# Patient Record
Sex: Female | Born: 2000 | Race: Black or African American | Hispanic: No | Marital: Single | Smoking: Never smoker
Health system: Southern US, Community
[De-identification: ages and names within clinical notes are randomized; demographics above are authoritative.]

## PROBLEM LIST (undated history)

## (undated) ENCOUNTER — Ambulatory Visit (HOSPITAL_COMMUNITY): Admission: EM | Payer: BC Managed Care – PPO

## (undated) DIAGNOSIS — Z789 Other specified health status: Secondary | ICD-10-CM

## (undated) HISTORY — PX: FOOT SURGERY: SHX648

## (undated) HISTORY — DX: Other specified health status: Z78.9

---
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
## 2022-01-14 ENCOUNTER — Ambulatory Visit (HOSPITAL_COMMUNITY)
Admission: EM | Admit: 2022-01-14 | Discharge: 2022-01-14 | Disposition: A | Discharge status: Home / Self Care | Payer: BC Managed Care – PPO | Attending: Internal Medicine | Admitting: Internal Medicine

## 2022-01-14 ENCOUNTER — Ambulatory Visit (INDEPENDENT_AMBULATORY_CARE_PROVIDER_SITE_OTHER): Payer: BC Managed Care – PPO

## 2022-01-14 ENCOUNTER — Encounter (HOSPITAL_COMMUNITY): Payer: Self-pay

## 2022-01-14 DIAGNOSIS — M79672 Pain in left foot: Secondary | ICD-10-CM | POA: Diagnosis not present

## 2022-01-14 DIAGNOSIS — W458XXA Other foreign body or object entering through skin, initial encounter: Secondary | ICD-10-CM

## 2022-01-14 DIAGNOSIS — S91312A Laceration without foreign body, left foot, initial encounter: Secondary | ICD-10-CM

## 2022-01-14 DIAGNOSIS — S91322A Laceration with foreign body, left foot, initial encounter: Secondary | ICD-10-CM

## 2022-01-14 NOTE — ED Triage Notes (Signed)
Cut to the left foot last night. States it stopped bleeding. Patient stepped in glass.

## 2022-01-14 NOTE — ED Provider Notes (Addendum)
[facility]    CSN: [id number] Arrival date & time: 01/14/22  1646      History   Chief Complaint Chief Complaint  Patient presents with   Laceration    HPI Pamela Wiley is a 21 y.o. female.   Patient presents to urgent care for evaluation after she stepped on a glass light bulb last night accidentally breaking the light bulb and cutting the bottom of her left foot.  There is a nonbleeding nondraining laceration present to the patient's left foot as a result of stepping on the glass.  She believes that she may have retained glass inside of the foot due to significant pain after the injury.  She is able to move all 5 toes on the left foot voluntarily and denies numbness and tingling distal to injury.  No numbness or tingling to the bilateral lower extremities.  She is able to ambulate with a limp.  She has not noticed any protruding foreign body coming out of the laceration.  She has not attempted use of any over-the-counter medications prior to arrival urgent care for symptoms.     History reviewed. No pertinent past medical history.  There are no problems to display for this patient.   History reviewed. No pertinent surgical history.  OB History   No obstetric history on file.      Home Medications    Prior to Admission medications   Medication Sig Start Date End Date Taking? Authorizing Provider  acetaminophen (TYLENOL) 500 MG tablet Take 2 tablets (1,000 mg total) by mouth every 6 (six) hours as needed. 11/23/21  Yes Talbot Grumbling, FNP  ibuprofen (ADVIL) 600 MG tablet Take 1 tablet (600 mg total) by mouth every 6 (six) hours as needed. 11/23/21  Yes Talbot Grumbling, FNP  fluconazole (DIFLUCAN) 150 MG tablet Take 1 tablet (150 mg total) by mouth daily. 04/27/21   Chase Picket, MD  naproxen (NAPROSYN) 500 MG tablet Take 1 tablet (500 mg total) by mouth 2 (two) times daily with a meal. 01/29/21   Jaynee Eagles, PA-C  penicillin v potassium  (VEETID) 500 MG tablet Take 1 tablet (500 mg total) by mouth 2 (two) times daily. 11/04/21   Nyoka Lint, PA-C    Family History Family History  Family history unknown: Yes    Social History Social History   Tobacco Use   Smoking status: Never   Smokeless tobacco: Never  Vaping Use   Vaping Use: Never used  Substance Use Topics   Alcohol use: No   Drug use: Yes    Types: Marijuana     Allergies   Patient has no known allergies.   Review of Systems Review of Systems Per HPI  Physical Exam Triage Vital Signs ED Triage Vitals [01/14/22 1721]  Enc Vitals Group     BP (!) 139/119     Pulse Rate 62     Resp      Temp 98.5 F (36.9 C)     Temp Source Oral     SpO2 98 %     Weight      Height      Head Circumference      Peak Flow      Pain Score 7     Pain Loc      Pain Edu?      Excl. in [place]?    No data found.  Updated Vital Signs BP (!) 139/119 (BP Location: Right Arm)  Pulse 62   Temp 98.5 F (36.9 C) (Oral)   LMP 12/23/2021 (Approximate)   SpO2 98%   Visual Acuity Right Eye Distance:   Left Eye Distance:   Bilateral Distance:    Right Eye Near:   Left Eye Near:    Bilateral Near:     Physical Exam Vitals and nursing note reviewed.  Constitutional:      Appearance: She is not ill-appearing or toxic-appearing.  HENT:     Head: Normocephalic and atraumatic.     Right Ear: Hearing and external ear normal.     Left Ear: Hearing and external ear normal.     Nose: Nose normal.     Mouth/Throat:     Lips: Pink.  Eyes:     General: Lids are normal. Vision grossly intact. Gaze aligned appropriately.     Extraocular Movements: Extraocular movements intact.     Conjunctiva/sclera: Conjunctivae normal.  Pulmonary:     Effort: Pulmonary effort is normal.  Musculoskeletal:     Cervical back: Neck supple.  Skin:    General: Skin is warm and dry.     Capillary Refill: Capillary refill takes less than 2 seconds.     Findings: No rash.      Comments: 0.5 to 1 cm laceration present to the plantar aspect of the left foot as seen below in image.  No drainage, soft tissue swelling, or surrounding area of erythema present.  Patient has full range of motion of the left foot and sensation is intact distally to injury.  Capillary refill is less than 3 with a +2 dorsalis pedis pulse on the left foot.  Neurological:     General: No focal deficit present.     Mental Status: She is alert and oriented to person, place, and time. Mental status is at baseline.     Cranial Nerves: No dysarthria or facial asymmetry.  Psychiatric:        Mood and Affect: Mood normal.        Speech: Speech normal.        Behavior: Behavior normal.        Thought Content: Thought content normal.        Judgment: Judgment normal.       UC Treatments / Results  Labs (all labs ordered are listed, but only abnormal results are displayed) Labs Reviewed - No data to display  EKG   Radiology DG Foot Complete Left  Result Date: 01/14/2022 CLINICAL DATA:  Glass laceration. Patient stepped on glass last night. EXAM: LEFT FOOT - COMPLETE 3+ VIEW COMPARISON:  Left ankle radiographs 06/12/2021, left foot radiographs 06/26/2017 FINDINGS: Pes planus is suggested on these limited non weight-bearing views. 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 There is a triangular low-density focus measuring up to 5 mm within the plantar midfoot soft tissues approximately 5 mm deep to the plantar skin surface, at the AP dimension of the cuboid, however this is not visualized on the other views for transverse foot dimension localization. No acute fracture or dislocation. 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 Triangular low-density focus within the plantar midfoot soft tissues could  represent a low-density foreign body. Recommend clinical correlation. Electronically Signed   By: Neita Garnet M.D.   On: 01/14/2022 18:14    Procedures Foreign Body Removal  Date/Time: 01/14/2022 8:50 PM  Performed by: Carlisle Beers, FNP Authorized by: Reita May  M, FNP   Consent:    Consent obtained:  Verbal   Consent given by:  Patient   Risks, benefits, and alternatives were discussed: yes     Risks discussed:  Bleeding, infection, pain, worsening of condition, poor cosmetic result, nerve damage and incomplete removal   Alternatives discussed:  No treatment and referral Universal protocol:    Imaging studies available: yes (X-ray)     Patient identity confirmed:  Verbally with patient Location:    Location:  Foot   Foot location:  L sole Pre-procedure details:    Imaging:  X-ray   Neurovascular status: intact   Procedure type:    Procedure complexity:  Simple Procedure details:    Localization method:  Probed   Removal mechanism:  Hemostat   Foreign bodies recovered:  1   Description:  Triangular shaped piece of glass   Intact foreign body removal: yes   Post-procedure details:    Neurovascular status: intact     Skin closure:  None   Dressing:  Adhesive bandage   Procedure completion:  Tolerated well, no immediate complications Comments:     Triangular shaped piece of glass removed with tweezers from patient's laceration. Foreign body at depth of subcutaneous tissue. Patient tolerated procedure well without local numbing.  Low suspicion for remaining foreign body to the laceration.  Full depth of the laceration explored with probing using tweezers without evidence of further foreign body present to the wound cavity.   (including critical care time)  Medications Ordered in UC Medications - No data to display  Initial Impression / Assessment and Plan / UC Course  I have reviewed the triage vital signs and the nursing notes.  Pertinent labs & imaging  results that were available during my care of the patient were reviewed by me and considered in my medical decision making (see chart for details).   1.  Laceration of the left foot and foreign body removal X-ray shows possible evidence of triangular low-density foreign body to the middle aspect of the foot at the location of the laceration in the subcutaneous tissues.  Wound explored with tweezers and foreign body removed from laceration successfully.  Minimal bleeding present after procedure.  Bandage placed on laceration.  Suspect I was able to remove entire foreign body as I was able to explore the wound cavity with the tweezers and could not feel or hear any further evidence of foreign body to the wound.  Patient tolerated this well without local anesthesia.  Advised patient to purchase over-the-counter triple antibiotic ointment and placed this on the wound over the next few days twice daily to prevent infection.  The wound does not appear infected at this time and there is no clinical indication for oral antibiotic therapy.  Walking referral to podiatry given to be used as needed for follow-up if symptoms fail to improve or persist. Patient expresses agreement with plan.  Of note, blood pressure initially read at 139/119.  Upon recheck prior to discharge, blood pressure reduced to 114/61.  Patient is asymptomatic and does not have a previous history of elevated blood pressure.  Discussed physical exam and available lab work findings in clinic with patient.  Counseled patient regarding appropriate use of medications and potential side effects for all medications recommended or prescribed today. Discussed red flag signs and symptoms of worsening condition,when to call the PCP office, return to urgent care, and when to seek higher level of care in the emergency department. Patient verbalizes understanding and agreement  with plan. All questions answered. Patient discharged in stable condition.    Final  Clinical Impressions(s) / UC Diagnoses   Final diagnoses:  Laceration of left foot, initial encounter  Other foreign body or object entering through skin, initial encounter     Discharge Instructions      We removed a piece of glass out of your left foot today.  Please over-the-counter antibiotic ointment to the bottom of your foot and keep the area covered with a Band-Aid for the next few days.  You may follow-up with the podiatrist for further evaluation of left foot pain after foreign body removal and for further evaluation.   Take 1000 mg Tylenol every 6 hours as needed for pain at home.  If you develop any new or worsening symptoms or do not improve in the next 2 to 3 days, please return.  If your symptoms are severe, please go to the emergency room.  Follow-up with your primary care provider for further evaluation and management of your symptoms as well as ongoing wellness visits.  I hope you feel better!     ED Prescriptions   None    PDMP not reviewed this encounter.   Carlisle Beers, FNP 01/14/22 2158    Carlisle Beers, FNP 01/26/22 [phone]

## 2022-01-14 NOTE — Discharge Instructions (Addendum)
We removed a piece of glass out of your left foot today.  Please over-the-counter antibiotic ointment to the bottom of your foot and keep the area covered with a Band-Aid for the next few days.  You may follow-up with the podiatrist for further evaluation of left foot pain after foreign body removal and for further evaluation.   Take 1000 mg Tylenol every 6 hours as needed for pain at home.  If you develop any new or worsening symptoms or do not improve in the next 2 to 3 days, please return.  If your symptoms are severe, please go to the emergency room.  Follow-up with your primary care provider for further evaluation and management of your symptoms as well as ongoing wellness visits.  I hope you feel better!

## 2022-01-23 ENCOUNTER — Encounter (HOSPITAL_COMMUNITY): Payer: Self-pay | Admitting: *Deleted

## 2022-01-23 ENCOUNTER — Ambulatory Visit (INDEPENDENT_AMBULATORY_CARE_PROVIDER_SITE_OTHER): Payer: Medicaid Other

## 2022-01-23 ENCOUNTER — Ambulatory Visit (HOSPITAL_COMMUNITY)
Admission: EM | Admit: 2022-01-23 | Discharge: 2022-01-23 | Disposition: A | Discharge status: Home / Self Care | Payer: Medicaid Other | Attending: Physician Assistant | Admitting: Physician Assistant

## 2022-01-23 DIAGNOSIS — M545 Low back pain, unspecified: Secondary | ICD-10-CM | POA: Diagnosis not present

## 2022-01-23 DIAGNOSIS — Z113 Encounter for screening for infections with a predominantly sexual mode of transmission: Secondary | ICD-10-CM | POA: Diagnosis not present

## 2022-01-23 DIAGNOSIS — M79672 Pain in left foot: Secondary | ICD-10-CM | POA: Diagnosis present

## 2022-01-23 DIAGNOSIS — S90852D Superficial foreign body, left foot, subsequent encounter: Secondary | ICD-10-CM | POA: Diagnosis not present

## 2022-01-23 LAB — POCT URINALYSIS DIPSTICK, ED / UC
Bilirubin Urine: NEGATIVE
Glucose, UA: NEGATIVE mg/dL
Hgb urine dipstick: NEGATIVE
Ketones, ur: NEGATIVE mg/dL
Leukocytes,Ua: NEGATIVE
Nitrite: NEGATIVE
Protein, ur: NEGATIVE mg/dL
Specific Gravity, Urine: 1.01 (ref 1.005–1.030)
Urobilinogen, UA: 0.2 mg/dL (ref 0.0–1.0)
pH: 6 (ref 5.0–8.0)

## 2022-01-23 LAB — POC URINE PREG, ED: Preg Test, Ur: NEGATIVE

## 2022-01-23 LAB — HIV ANTIBODY (ROUTINE TESTING W REFLEX): HIV Screen 4th Generation wRfx: NONREACTIVE

## 2022-01-23 MED ORDER — DOXYCYCLINE HYCLATE 100 MG PO CAPS: 100.0000 mg | ORAL_CAPSULE | Freq: Two times a day (BID) | ORAL | 0 refills | Status: DC

## 2022-01-23 NOTE — ED Provider Notes (Signed)
[facility]    CSN: [id number] Arrival date & time: 01/23/22  1042      History   Chief Complaint Chief Complaint  Patient presents with   Foot Pain   SEXUALLY TRANSMITTED DISEASE    HPI Pamela Wiley is a 21 y.o. female.   21 year old female presents with left foot pain and STI screening.  Patient returns today for reevaluation of her left foot pain.  She indicates that she was seen on 01/14/2022 for glass in the foot and laceration.  She was evaluated, treated and a piece of wedge shaped glass was removed.  Patient indicates she still continues with pain at the bottom of the left foot where the laceration site is, mild swelling, and minimal redness.  She indicates having pain when she walks or puts pressure on the foot.  She has been taking Tylenol but this has not been giving her relief.  She has concerned about there still being pain in the left foot since its been over a week since the accident.  She denies any fever or chills.  She has not seen a podiatrist although she was given a walking referral at her initial visit on 01/14/2022. Patient also request to have STI testing to include HIV and RPR.  Patient indicates that her last sexual encounter was several months ago and it was protected.  She indicates that she is not having any symptoms, no vaginal discharge.  She requests to also have a UA test although she is not have any frequency, urgency or dysuria.  She does relate having some low back pain which is intermittent due to "the size of my breasts".  She relates that her last period was September 10 and was regular.  She denies any fever, nausea or vomiting.   Foot Pain    History reviewed. No pertinent past medical history.  There are no problems to display for this patient.   History reviewed. No pertinent surgical history.  OB History   No obstetric history on file.      Home Medications    Prior to Admission medications   Medication Sig Start  Date End Date Taking? Authorizing Provider  acetaminophen (TYLENOL) 500 MG tablet Take 2 tablets (1,000 mg total) by mouth every 6 (six) hours as needed. 11/23/21  Yes Carlisle Beers, FNP  doxycycline (VIBRAMYCIN) 100 MG capsule Take 1 capsule (100 mg total) by mouth 2 (two) times daily. 01/23/22  Yes Ellsworth Lennox, PA-C  ibuprofen (ADVIL) 600 MG tablet Take 1 tablet (600 mg total) by mouth every 6 (six) hours as needed. 11/23/21  Yes Carlisle Beers, FNP  naproxen (NAPROSYN) 500 MG tablet Take 1 tablet (500 mg total) by mouth 2 (two) times daily with a meal. 01/29/21  Yes Wallis Bamberg, PA-C  fluconazole (DIFLUCAN) 150 MG tablet Take 1 tablet (150 mg total) by mouth daily. 04/27/21   Lamptey, Britta Mccreedy, MD  penicillin v potassium (VEETID) 500 MG tablet Take 1 tablet (500 mg total) by mouth 2 (two) times daily. 11/04/21   Ellsworth Lennox, PA-C    Family History Family History  Family history unknown: Yes    Social History Social History   Tobacco Use   Smoking status: Never   Smokeless tobacco: Never  Vaping Use   Vaping Use: Never used  Substance Use Topics   Alcohol use: No   Drug use: Yes    Types: Marijuana     Allergies   Patient has no known allergies.  Review of Systems Review of Systems  Musculoskeletal:  Positive for joint swelling (left foot at laceration site).     Physical Exam Triage Vital Signs ED Triage Vitals  Enc Vitals Group     BP 01/23/22 1119 118/80     Pulse Rate 01/23/22 1119 68     Resp 01/23/22 1119 18     Temp 01/23/22 1119 98.1 F (36.7 C)     Temp Source 01/23/22 1119 Oral     SpO2 01/23/22 1119 97 %     Weight --      Height --      Head Circumference --      Peak Flow --      Pain Score 01/23/22 1118 7     Pain Loc --      Pain Edu? --      Excl. in [place]? --    No data found.  Updated Vital Signs BP 118/80 (BP Location: Right Arm)   Pulse 68   Temp 98.1 F (36.7 C) (Oral)   Resp 18   LMP 12/23/2021 (Approximate)   SpO2  97%   Visual Acuity Right Eye Distance:   Left Eye Distance:   Bilateral Distance:    Right Eye Near:   Left Eye Near:    Bilateral Near:     Physical Exam Constitutional:      Appearance: Normal appearance.  Musculoskeletal:       Feet:  Neurological:     Mental Status: She is alert.      UC Treatments / Results  Labs (all labs ordered are listed, but only abnormal results are displayed) Labs Reviewed  RPR  HIV ANTIBODY (ROUTINE TESTING W REFLEX)  POCT URINALYSIS DIPSTICK, ED / UC  POC URINE PREG, ED  CERVICOVAGINAL ANCILLARY ONLY    EKG   Radiology DG Foot Complete Left  Result Date: 01/23/2022 CLINICAL DATA:  Glass removed from mid plantar arch of foot 01/14/2022. persistent pain. EXAM: LEFT FOOT - COMPLETE 3+ VIEW COMPARISON:  Left foot radiographs 01/14/2022 FINDINGS: The triangular density measuring up to approximately 5 mm overlying the plantar midfoot subcutaneous fat just deep to the skin surface is again seen on lateral view but not on the frontal or oblique views, possibly due to overlying bones. This is unchanged from prior and again could represent a low-density foreign body such as glass, however is not definitive. This is not significantly changed from 01/14/2022. Redemonstration of cartilaginous versus fibrous calcaneonavicular coalition with mild joint space narrowing and moderate subchondral sclerosis and peripheral osteophytosis. No acute fracture or dislocation. Pes planus. IMPRESSION: 1. No significant change from prior. There is again a triangular low-density focus within the plantar midfoot soft tissues. This may represent the patient's normal soft tissues as it is unchanged from prior following reported removal of glass from this mid plantar arch location of the patient's persistent pain. Recommend clinical correlation for a possible persistent glass foreign body in this region. 2. 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 Electronically Signed   By: Yvonne Kendall M.D.   On: 01/23/2022 12:26    Procedures Procedures (including critical care time)  Medications Ordered in UC Medications - No data to display  Initial Impression / Assessment and Plan / UC Course  I have reviewed the triage vital signs and the nursing notes.  Pertinent labs & imaging results that were available during my care of the patient were reviewed by me and considered in my  medical decision making (see chart for details).    Plan: 1.  STI screening will be treated with the following: A.  STI testing to include HIV and RPR is pending. 2.  The acute low back pain will be treated with the following: A.  Advised take ibuprofen 600 mg every 8 hours to treat the low back pain. 3.  Left foot will be treated with the following: A.  Ibuprofen every 8 hours with food to help reduce pain and swelling. B.  Epsom salt soaks, 3-4 times throughout the evening to help reduce pain and swelling. C.  Patient advised to call and set up an appointment for podiatry to have the left foot evaluated. D.  Doxycycline 100 mg every 12 hours to treat infection. 4.  Advised to follow-up PCP return to urgent care if symptoms fail to improve.  Final Clinical Impressions(s) / UC Diagnoses   Final diagnoses:  Left foot pain  Foreign body in left foot, subsequent encounter  Routine screening for STI (sexually transmitted infection)  Acute midline low back pain without sciatica     Discharge Instructions      Lab testing will be completed in 48 to 72 hours.  If you do not get a call from this office in 48 to 72 hours this indicates lab tests are negative.  Log into MyChart to view the test results when they post in 48 to 72 hours.  Advised to use Epsom salt soaks, 1 tablespoon in warm water, soak for 10 minutes, repeat 3-4 times of the evening to help decrease swelling and pain.  Advised take ibuprofen 600 mg  every 8 hours with food to help decrease pain and swelling.  Advised to call [facility] for an appointment  [phone]) and evaluate the left foot pain and swelling.  Advised to follow-up with PCP or return to urgent care if symptoms fail to improve.      ED Prescriptions     Medication Sig Dispense Auth. Provider   doxycycline (VIBRAMYCIN) 100 MG capsule Take 1 capsule (100 mg total) by mouth 2 (two) times daily. 20 capsule Ellsworth Lennox, PA-C      PDMP not reviewed this encounter.   Ellsworth Lennox, PA-C 01/23/22 1233

## 2022-01-23 NOTE — Discharge Instructions (Addendum)
Lab testing will be completed in 48 to 72 hours.  If you do not get a call from this office in 48 to 72 hours this indicates lab tests are negative.  Log into MyChart to view the test results when they post in 48 to 72 hours.  Advised to use Epsom salt soaks, 1 tablespoon in warm water, soak for 10 minutes, repeat 3-4 times of the evening to help decrease swelling and pain.  Advised take ibuprofen 600 mg every 8 hours with food to help decrease pain and swelling.  Advised to call [facility] specialist for an appointment  [phone]) and evaluate the left foot pain and swelling.  Advised to follow-up with PCP or return to urgent care if symptoms fail to improve.

## 2022-01-23 NOTE — ED Triage Notes (Signed)
Pt states she had glass took out of her left foot on 01/14/2022 but her foot is still hurting she is taking tylenol but not helping.   She would also like all STI testing states no sx and no known exposures just wants it all tested.

## 2022-01-24 LAB — CERVICOVAGINAL ANCILLARY ONLY
Bacterial Vaginitis (gardnerella): NEGATIVE
Candida Glabrata: NEGATIVE
Candida Vaginitis: NEGATIVE
Chlamydia: NEGATIVE
Comment: NEGATIVE
Comment: NEGATIVE
Comment: NEGATIVE
Comment: NEGATIVE
Comment: NEGATIVE
Comment: NORMAL
Neisseria Gonorrhea: NEGATIVE
Trichomonas: NEGATIVE

## 2022-01-24 LAB — RPR: RPR Ser Ql: NONREACTIVE

## 2022-01-27 ENCOUNTER — Telehealth: Payer: Self-pay | Admitting: Podiatry

## 2022-01-27 NOTE — Telephone Encounter (Signed)
Lvm for patient to call and schedule an appointment to pick up orthotics  ?

## 2022-01-31 ENCOUNTER — Ambulatory Visit (INDEPENDENT_AMBULATORY_CARE_PROVIDER_SITE_OTHER): Payer: Medicaid Other

## 2022-01-31 DIAGNOSIS — M2141 Flat foot [pes planus] (acquired), right foot: Secondary | ICD-10-CM

## 2022-01-31 DIAGNOSIS — M2142 Flat foot [pes planus] (acquired), left foot: Secondary | ICD-10-CM

## 2022-01-31 NOTE — Progress Notes (Signed)
Patient presents today to pick up custom molded foot orthotics, diagnosed with pes planus by Dr. Sherryle Lis.   Orthotics were dispensed and fit was satisfactory. Reviewed instructions for break-in and wear. Written instructions given to patient.  Patient will follow up as needed.   [facility] - order # [id number]

## 2022-02-13 ENCOUNTER — Ambulatory Visit (INDEPENDENT_AMBULATORY_CARE_PROVIDER_SITE_OTHER): Payer: Medicaid Other | Admitting: Podiatry

## 2022-02-13 ENCOUNTER — Telehealth: Payer: Self-pay | Admitting: Podiatry

## 2022-02-13 ENCOUNTER — Ambulatory Visit (INDEPENDENT_AMBULATORY_CARE_PROVIDER_SITE_OTHER): Payer: Medicaid Other

## 2022-02-13 ENCOUNTER — Encounter: Payer: Self-pay | Admitting: Podiatry

## 2022-02-13 DIAGNOSIS — S90852A Superficial foreign body, left foot, initial encounter: Secondary | ICD-10-CM | POA: Diagnosis not present

## 2022-02-13 NOTE — Telephone Encounter (Addendum)
DOS: 02/23/2022  [employer]  Procedure: Ex. Foreign Body-Complicated Lt (38101)  DX: M79.5  Prior Authorization is required per [employer] on the [employer].   Authorization #: [id number] Authorization Valid 02/24/2028 - 03/24/2022

## 2022-02-13 NOTE — Progress Notes (Signed)
  Subjective:  Patient ID: Pamela Wiley, female    DOB: [birth year],  MRN: [id number]  Chief Complaint  Patient presents with    Laceration     Left foot cut, went to urgent care - she stepped on a light bulb 2 or 3 weeks ago and it broke under her foot - Urgent care did xrays and got out what they could - still feels like there is something in it    21 y.o. female presents with the above complaint. History confirmed with patient.  They took some of the glass out but they think it may have broken off  Objective:  Physical Exam: warm, good capillary refill, no trophic changes or ulcerative lesions, normal DP and PT pulses, normal sensory exam, and clear puncture wound plantar left heel, tender to touch no signs of infection   Radiographs: Multiple views x-ray of the left foot: New films taken today show residual foreign body retained Assessment:   1. Foreign body in left foot, initial encounter      Plan:  Patient was evaluated and treated and all questions answered.  We reviewed her radiographs.  Discussed that she likely does have some foreign body retained.  We discussed further excision of this.  I do think this should be done in the operating room for stability and for pain control for the patient so she may have some sedation.  We discussed the risk benefits and potential complications of the procedure.  We discussed that if a significant incision is needed she will need to be nonweightbearing after surgery.  Surgery will be scheduled for next week informed consent was signed and reviewed today.  No follow-ups on file.

## 2022-02-14 NOTE — Progress Notes (Signed)
Subjective:    Pamela Wiley - 21 y.o. female MRN [id number]  Date of birth: 11-13-2000  HPI  Pamela Wiley is a 21 year-old female to establish care and annual physical exam.  Current issues and/or concerns: - Concerns of keloids. Reports in the past when she pierced the lower left earlobe a keloid developed. States when she pierces the upper ear she does not develop keloids. Her mother also has history of keloids. The left keloid is asymptomatic and has been the same size for some time. She is not ready for referral to Dermatology as of present. She is aware to notify me if she would like a referral in the future.  - Established with Podiatry. Reports plan to have left foot surgery soon due to glass in foot secondary to stepping on a light bulb. - Established with Gynecology.    ROS per HPI    Health Maintenance: Health Maintenance Due  Topic Date Due   COVID-19 Vaccine (1) Never done    Past Medical History: Patient Active Problem List   Diagnosis Date Noted   Tonsillitis 06/04/2018    Social History   reports that she has never smoked. She has never been exposed to tobacco smoke. She has never used smokeless tobacco. She reports current alcohol use of about 2.0 standard drinks of alcohol per week. She reports current drug use. Drug: Marijuana.   Family History  Family history is unknown by patient.   Medications: reviewed and updated   Objective:   Physical Exam BP 108/73 (BP Location: Left Arm, Patient Position: Sitting, Cuff Size: Normal)   Pulse 100   Temp 98.3 F (36.8 C)   Resp 16   Ht 5' 2.99" (1.6 m)   Wt 185 lb (83.9 kg)   SpO2 98%   BMI 32.78 kg/m   Physical Exam HENT:     Head: Normocephalic and atraumatic.     Right Ear: Tympanic membrane, ear canal and external ear normal.     Left Ear: Tympanic membrane, ear canal and external ear normal.     Ears:     Comments: Left earlobe keloid. No evidence of drainage or  pain.    Nose: Nose normal.     Mouth/Throat:     Mouth: Mucous membranes are moist.     Pharynx: Oropharynx is clear.  Eyes:     Extraocular Movements: Extraocular movements intact.     Conjunctiva/sclera: Conjunctivae normal.     Pupils: Pupils are equal, round, and reactive to light.  Cardiovascular:     Rate and Rhythm: Normal rate and regular rhythm.     Pulses: Normal pulses.     Heart sounds: Normal heart sounds.  Pulmonary:     Effort: Pulmonary effort is normal.     Breath sounds: Normal breath sounds.  Chest:     Comments: Patient declined.  Abdominal:     General: Bowel sounds are normal.     Palpations: Abdomen is soft.  Genitourinary:    Comments: Patient declined. Musculoskeletal:        General: Normal range of motion.     Right shoulder: Normal.     Left shoulder: Normal.     Right upper arm: Normal.     Left upper arm: Normal.     Right elbow: Normal.     Left elbow: Normal.     Right forearm: Normal.     Left forearm: Normal.     Right wrist: Normal.  Left wrist: Normal.     Right hand: Normal.     Left hand: Normal.     Cervical back: Normal, normal range of motion and neck supple.     Thoracic back: Normal.     Lumbar back: Normal.     Right hip: Normal.     Left hip: Normal.     Right upper leg: Normal.     Left upper leg: Normal.     Right knee: Normal.     Left knee: Normal.     Right lower leg: Normal.     Left lower leg: Normal.     Right ankle: Normal.     Left ankle: Normal.     Right foot: Normal.     Left foot: Normal.  Skin:    General: Skin is warm and dry.     Capillary Refill: Capillary refill takes less than 2 seconds.  Neurological:     General: No focal deficit present.     Mental Status: She is alert and oriented to person, place, and time.  Psychiatric:        Mood and Affect: Mood normal.        Behavior: Behavior normal.      Assessment & Plan:  1. Encounter to establish care 2. Annual physical exam -  Counseled on 150 minutes of exercise per week as tolerated, healthy eating (including decreased daily intake of saturated fats, cholesterol, added sugars, sodium), STI prevention, and routine healthcare maintenance.  3. Screening for metabolic disorder - Routine screening.  - CMP14+EGFR  4. Screening for deficiency anemia - Routine screening.  - CBC  5. Diabetes mellitus screening - Routine screening.  - Hemoglobin A1c  6. Screening cholesterol level - Routine screening.  - Lipid panel  7. Thyroid disorder screen - Routine screening.  - TSH  8. Keloid - Patient declined referral to Dermatology as of present.     Patient was given clear instructions to go to Emergency Department or return to medical center if symptoms don't improve, worsen, or new problems develop.The patient verbalized understanding.  I discussed the assessment and treatment plan with the patient. The patient was provided an opportunity to ask questions and all were answered. The patient agreed with the plan and demonstrated an understanding of the instructions.   The patient was advised to call back or seek an in-person evaluation if the symptoms worsen or if the condition fails to improve as anticipated.    Durene Fruits, NP 02/20/2022, 9:23 AM Primary Care at [facility]

## 2022-02-20 ENCOUNTER — Ambulatory Visit (INDEPENDENT_AMBULATORY_CARE_PROVIDER_SITE_OTHER): Payer: Medicaid Other | Admitting: Family

## 2022-02-20 ENCOUNTER — Encounter: Payer: Self-pay | Admitting: Family

## 2022-02-20 VITALS — BP 108/73 | HR 100 | Temp 98.3°F | Resp 16 | Ht 62.99 in | Wt 185.0 lb

## 2022-02-20 DIAGNOSIS — Z1329 Encounter for screening for other suspected endocrine disorder: Secondary | ICD-10-CM | POA: Diagnosis not present

## 2022-02-20 DIAGNOSIS — Z13220 Encounter for screening for lipoid disorders: Secondary | ICD-10-CM

## 2022-02-20 DIAGNOSIS — Z131 Encounter for screening for diabetes mellitus: Secondary | ICD-10-CM

## 2022-02-20 DIAGNOSIS — Z130 Encounter for screening for diseases of the blood and blood-forming organs and certain disorders involving the immune mechanism: Secondary | ICD-10-CM

## 2022-02-20 DIAGNOSIS — Z13228 Encounter for screening for other metabolic disorders: Secondary | ICD-10-CM | POA: Diagnosis not present

## 2022-02-20 DIAGNOSIS — Z0000 Encounter for general adult medical examination without abnormal findings: Secondary | ICD-10-CM

## 2022-02-20 DIAGNOSIS — Z7689 Persons encountering health services in other specified circumstances: Secondary | ICD-10-CM

## 2022-02-20 DIAGNOSIS — L910 Hypertrophic scar: Secondary | ICD-10-CM

## 2022-02-20 DIAGNOSIS — Z0001 Encounter for general adult medical examination with abnormal findings: Secondary | ICD-10-CM | POA: Diagnosis not present

## 2022-02-20 NOTE — Patient Instructions (Signed)
Preventive Care 50-19 Years Old, Female Preventive care refers to lifestyle choices and visits with your health care provider that can promote health and wellness. At this stage in your life, you may start seeing a primary care physician instead of a pediatrician for your preventive care. Preventive care visits are also called wellness exams. What can I expect for my preventive care visit? Counseling During your preventive care visit, your health care provider may ask about your: Medical history, including: Past medical problems. Family medical history. Pregnancy history. Current health, including: Menstrual cycle. Method of birth control. Emotional well-being. Home life and relationship well-being. Sexual activity and sexual health. Lifestyle, including: Alcohol, nicotine or tobacco, and drug use. Access to firearms. Diet, exercise, and sleep habits. Sunscreen use. Motor vehicle safety. Physical exam Your health care provider may check your: Height and weight. These may be used to calculate your BMI (body mass index). BMI is a measurement that tells if you are at a healthy weight. Waist circumference. This measures the distance around your waistline. This measurement also tells if you are at a healthy weight and may help predict your risk of certain diseases, such as type 2 diabetes and high blood pressure. Heart rate and blood pressure. Body temperature. Skin for abnormal spots. Breasts. What immunizations do I need?  Vaccines are usually given at various ages, according to a schedule. Your health care provider will recommend vaccines for you based on your age, medical history, and lifestyle or other factors, such as travel or where you work. What tests do I need? Screening Your health care provider may recommend screening tests for certain conditions. This may include: Vision and hearing tests. Lipid and cholesterol levels. Pelvic exam and Pap test. Hepatitis B  test. Hepatitis C test. HIV (human immunodeficiency virus) test. STI (sexually transmitted infection) testing, if you are at risk. Tuberculosis skin test if you have symptoms. BRCA-related cancer screening. This may be done if you have a family history of breast, ovarian, tubal, or peritoneal cancers. Talk with your health care provider about your test results, treatment options, and if necessary, the need for more tests. Follow these instructions at home: Eating and drinking  Eat a healthy diet that includes fresh fruits and vegetables, whole grains, lean protein, and low-fat dairy products. Drink enough fluid to keep your urine pale yellow. Do not drink alcohol if: Your health care provider tells you not to drink. You are pregnant, may be pregnant, or are planning to become pregnant. You are under the legal drinking age. In the [place] the legal drinking age is 67. If you drink alcohol: Limit how much you have to 0-1 drink a day. Know how much alcohol is in your drink. In the [place] one drink equals one 12 oz bottle of beer (355 mL), one 5 oz glass of wine (148 mL), or one 1 oz glass of hard liquor (44 mL). Lifestyle Brush your teeth every morning and night with fluoride toothpaste. Floss one time each day. Exercise for at least 30 minutes 5 or more days of the week. Do not use any products that contain nicotine or tobacco. These products include cigarettes, chewing tobacco, and vaping devices, such as e-cigarettes. If you need help quitting, ask your health care provider. Do not use drugs. If you are sexually active, practice safe sex. Use a condom or other form of protection to prevent STIs. If you do not wish to become pregnant, use a form of birth control. If you plan to become pregnant,  see your health care provider for a prepregnancy visit. Find healthy ways to manage stress, such as: Meditation, yoga, or listening to music. Journaling. Talking to a trusted person. Spending time  with friends and family. Safety Always wear your seat belt while driving or riding in a vehicle. Do not drive: If you have been drinking alcohol. Do not ride with someone who has been drinking. When you are tired or distracted. While texting. If you have been using any mind-altering substances or drugs. Wear a helmet and other protective equipment during sports activities. If you have firearms in your house, make sure you follow all gun safety procedures. Seek help if you have been bullied, physically abused, or sexually abused. Use the internet responsibly to avoid dangers, such as online bullying and online sex predators. What's next? Go to your health care provider once a year for an annual wellness visit. Ask your health care provider how often you should have your eyes and teeth checked. Stay up to date on all vaccines. This information is not intended to replace advice given to you by your health care provider. Make sure you discuss any questions you have with your health care provider. Document Revised: 09/28/2020 Document Reviewed: 09/28/2020 Elsevier Patient Education  2023 [employer]vier Inc.  

## 2022-02-20 NOTE — Progress Notes (Signed)
.  Pt presents to establish care,  -haven't seen PCP in years  -has surgery in 3 days for foot

## 2022-02-21 ENCOUNTER — Encounter: Payer: Self-pay | Admitting: Family

## 2022-02-21 DIAGNOSIS — R7303 Prediabetes: Secondary | ICD-10-CM | POA: Insufficient documentation

## 2022-02-21 LAB — LIPID PANEL
Chol/HDL Ratio: 2 ratio (ref 0.0–4.4)
Cholesterol, Total: 79 mg/dL — ABNORMAL LOW (ref 100–169)
HDL: 40 mg/dL (ref >39)
LDL Chol Calc (NIH): 26 mg/dL (ref 0–109)
Triglycerides: 51 mg/dL (ref 0–89)
VLDL Cholesterol Cal: 13 mg/dL (ref 5–40)

## 2022-02-21 LAB — CMP14+EGFR
ALT: 12 IU/L (ref 0–32)
AST: 16 IU/L (ref 0–40)
Albumin/Globulin Ratio: 1.4 (ref 1.2–2.2)
Albumin: 4.2 g/dL (ref 4.0–5.0)
Alkaline Phosphatase: 79 IU/L (ref 42–106)
BUN/Creatinine Ratio: 15 (ref 9–23)
BUN: 9 mg/dL (ref 6–20)
Bilirubin Total: 0.2 mg/dL (ref 0.0–1.2)
CO2: 23 mmol/L (ref 20–29)
Calcium: 8.9 mg/dL (ref 8.7–10.2)
Chloride: 103 mmol/L (ref 96–106)
Creatinine, Ser: 0.6 mg/dL (ref 0.57–1.00)
Globulin, Total: 3 g/dL (ref 1.5–4.5)
Glucose: 102 mg/dL — ABNORMAL HIGH (ref 70–99)
Potassium: 3.7 mmol/L (ref 3.5–5.2)
Sodium: 139 mmol/L (ref 134–144)
Total Protein: 7.2 g/dL (ref 6.0–8.5)
eGFR: 133 mL/min/{1.73_m2} (ref >59)

## 2022-02-21 LAB — HEMOGLOBIN A1C
Est. average glucose Bld gHb Est-mCnc: 123 mg/dL
Hgb A1c MFr Bld: 5.9 % — ABNORMAL HIGH (ref 4.8–5.6)

## 2022-02-21 LAB — CBC
Hematocrit: 37.9 % (ref 34.0–46.6)
Hemoglobin: 11.9 g/dL (ref 11.1–15.9)
MCH: 24.6 pg — ABNORMAL LOW (ref 26.6–33.0)
MCHC: 31.4 g/dL — ABNORMAL LOW (ref 31.5–35.7)
MCV: 78 fL — ABNORMAL LOW (ref 79–97)
Platelets: 332 10*3/uL (ref 150–450)
RBC: 4.84 x10E6/uL (ref 3.77–5.28)
RDW: 13.7 % (ref 11.7–15.4)
WBC: 9.4 10*3/uL (ref 3.4–10.8)

## 2022-02-21 LAB — TSH: TSH: 1.69 u[IU]/mL (ref 0.450–4.500)

## 2022-02-23 ENCOUNTER — Other Ambulatory Visit: Payer: Self-pay | Admitting: Podiatry

## 2022-02-23 DIAGNOSIS — S91332A Puncture wound without foreign body, left foot, initial encounter: Secondary | ICD-10-CM

## 2022-02-23 MED ORDER — TRAMADOL HCL 50 MG PO TABS: 50.0000 mg | ORAL_TABLET | Freq: Four times a day (QID) | ORAL | 0 refills | Status: AC | PRN

## 2022-02-23 MED ORDER — IBUPROFEN 800 MG PO TABS: 800.0000 mg | ORAL_TABLET | Freq: Three times a day (TID) | ORAL | 0 refills | Status: DC | PRN

## 2022-02-23 NOTE — Progress Notes (Signed)
11/10 removal of glass from left foot

## 2022-03-01 ENCOUNTER — Ambulatory Visit (INDEPENDENT_AMBULATORY_CARE_PROVIDER_SITE_OTHER): Payer: Medicaid Other

## 2022-03-01 ENCOUNTER — Ambulatory Visit (INDEPENDENT_AMBULATORY_CARE_PROVIDER_SITE_OTHER): Payer: Medicaid Other | Admitting: Podiatry

## 2022-03-01 DIAGNOSIS — S90852A Superficial foreign body, left foot, initial encounter: Secondary | ICD-10-CM

## 2022-03-01 NOTE — Progress Notes (Signed)
  Subjective:  Patient ID: Pamela Wiley, female    DOB: [birth year],  MRN: [id number]  Chief Complaint  Patient presents with   Post-op Follow-up    Post op visit #1 DOS 02/23/2022 GLASS REMOVAL LEFT FOOT     21 y.o. female returns for post-op check.  She is doing well using the crutches  Review of Systems: Negative except as noted in the HPI. Denies N/V/F/Ch.   Objective:  There were no vitals filed for this visit. There is no height or weight on file to calculate BMI. Constitutional Well developed. Well nourished.  Vascular Foot warm and well perfused. Capillary refill normal to all digits.  Calf is soft and supple, no posterior calf or knee pain, negative Homans' sign  Neurologic Normal speech. Oriented to person, place, and time. Epicritic sensation to light touch grossly present bilaterally.  Dermatologic Skin healing well without signs of infection. Skin edges well coapted without signs of infection.  Orthopedic: Tenderness to palpation noted about the surgical site.   Multiple view plain film radiographs: Interval removal of foreign body Assessment:   1. Foreign body in left foot, initial encounter    Plan:  Patient was evaluated and treated and all questions answered.  S/p foot surgery left -Progressing as expected post-operatively. -XR: Noted above no residual foreign body noted -WB Status: She may continue toe-touch weightbearing in the cam boot until her next visit when her sutures removed.  Thereafter may transition to weightbearing as tolerated in the boot or regular shoe gear if she is ready for this -Sutures: Removed in 2 weeks. -Medications: No refills required -Foot redressed.  She may remove the dressing on Monday and shower or use antibacterial soap such as Dial.  No soaking or scrubbing.  Advised she may begin to apply Vaseline antibiotic ointment or lotion to the foot and scar at that point. -She will follow-up in 2 weeks on the nursing  schedule for suture removal she will follow with me thereafter only as needed  Return in about 2 weeks (around 03/15/2022) for suture removal.

## 2022-03-15 ENCOUNTER — Encounter: Payer: Self-pay | Admitting: Podiatry

## 2022-03-15 ENCOUNTER — Ambulatory Visit (INDEPENDENT_AMBULATORY_CARE_PROVIDER_SITE_OTHER): Payer: Medicaid Other

## 2022-03-15 ENCOUNTER — Encounter: Payer: Medicaid Other | Admitting: Podiatry

## 2022-03-15 DIAGNOSIS — S90852A Superficial foreign body, left foot, initial encounter: Secondary | ICD-10-CM

## 2022-03-15 NOTE — Progress Notes (Signed)
Patient presents today for post op visit # 2, patient of Dr. Lilian Kapur.   POV # 2 DOS 02/23/22  Glass Removal Left Foot   She presents in her [place] tennis shoe with crutches. Denies any falls or injury to the foot. Foot is swollen. No signs of infection. No calf pain or shortness of breath. Bandages dry and intact. Incision is intact. She is taking her pain medication and antibiotic regularly, as instructed.      Sutures were removed today and patient tolerated it well. Dr. Lilian Kapur did take a look at her foot today as well.   Patient does not need to follow-up at this point unless new problems arise.Marland Kitchen

## 2022-03-23 ENCOUNTER — Encounter (HOSPITAL_COMMUNITY): Payer: Self-pay

## 2022-03-23 ENCOUNTER — Ambulatory Visit (HOSPITAL_COMMUNITY)
Admission: RE | Admit: 2022-03-23 | Discharge: 2022-03-23 | Disposition: A | Discharge status: Home / Self Care | Payer: Medicaid Other | Source: Ambulatory Visit | Attending: Family Medicine | Admitting: Family Medicine

## 2022-03-23 VITALS — BP 136/75 | HR 96 | Temp 98.2°F | Resp 16

## 2022-03-23 DIAGNOSIS — Z113 Encounter for screening for infections with a predominantly sexual mode of transmission: Secondary | ICD-10-CM | POA: Diagnosis not present

## 2022-03-23 DIAGNOSIS — J020 Streptococcal pharyngitis: Secondary | ICD-10-CM

## 2022-03-23 LAB — POCT URINALYSIS DIPSTICK, ED / UC
Glucose, UA: NEGATIVE mg/dL
Leukocytes,Ua: NEGATIVE
Nitrite: NEGATIVE
Protein, ur: 100 mg/dL — AB
Specific Gravity, Urine: 1.025 (ref 1.005–1.030)
Urobilinogen, UA: 0.2 mg/dL (ref 0.0–1.0)
pH: 6 (ref 5.0–8.0)

## 2022-03-23 LAB — HIV ANTIBODY (ROUTINE TESTING W REFLEX): HIV Screen 4th Generation wRfx: NONREACTIVE

## 2022-03-23 LAB — POCT RAPID STREP A, ED / UC: Streptococcus, Group A Screen (Direct): POSITIVE — AB

## 2022-03-23 MED ORDER — FLUCONAZOLE 150 MG PO TABS: ORAL_TABLET | ORAL | 0 refills | Status: DC

## 2022-03-23 MED ORDER — AMOXICILLIN 500 MG PO CAPS: 500.0000 mg | ORAL_CAPSULE | Freq: Three times a day (TID) | ORAL | 0 refills | Status: DC

## 2022-03-23 NOTE — ED Provider Notes (Signed)
[facility]    CSN: [id number] Arrival date & time: 03/23/22  1240      History   Chief Complaint Chief Complaint  Patient presents with   Exposure to STD    HPI Pamela Wiley is a 21 y.o. female.   Patient is here for possible strep.  She has had a sore throat x 3 days.  Her mother had strep throat earlier in the week.  No fevers, chills.  + congestion, fever several days ago.  Also with headaches.   She also wants STD testing.  No known exposure per se.  She does have h/o chlamydia in her throat and would like to be tested there as well today.  She did a vaginal swab for std testing today.  No vaginal symptoms noted.  Would like lab work as well.         History reviewed. No pertinent past medical history.  Patient Active Problem List   Diagnosis Date Noted   Prediabetes 02/21/2022   Tonsillitis 06/04/2018    History reviewed. No pertinent surgical history.  OB History   No obstetric history on file.      Home Medications    Prior to Admission medications   Medication Sig Start Date End Date Taking? Authorizing Provider  acetaminophen (TYLENOL) 500 MG tablet Take 2 tablets (1,000 mg total) by mouth every 6 (six) hours as needed. 11/23/21   Carlisle Beers, FNP  ibuprofen (ADVIL) 800 MG tablet Take 1 tablet (800 mg total) by mouth every 8 (eight) hours as needed. 02/23/22   Edwin Cap, DPM    Family History Family History  Family history unknown: Yes    Social History Social History   Tobacco Use   Smoking status: Never    Passive exposure: Never   Smokeless tobacco: Never  Vaping Use   Vaping Use: Some days  Substance Use Topics   Alcohol use: Yes    Alcohol/week: 2.0 standard drinks of alcohol    Types: 2 Shots of liquor per week    Comment: sometimes   Drug use: Yes    Types: Marijuana     Allergies   Patient has no known allergies.   Review of Systems Review of Systems  Constitutional:   Positive for fatigue and fever.  HENT:  Positive for congestion, rhinorrhea and sore throat.   Respiratory:  Positive for cough.   Cardiovascular: Negative.   Gastrointestinal: Negative.   Genitourinary: Negative.   Musculoskeletal: Negative.   Psychiatric/Behavioral: Negative.       Physical Exam Triage Vital Signs ED Triage Vitals [03/23/22 1334]  Enc Vitals Group     BP 136/75     Pulse Rate 96     Resp 16     Temp 98.2 F (36.8 C)     Temp Source Oral     SpO2 97 %     Weight      Height      Head Circumference      Peak Flow      Pain Score      Pain Loc      Pain Edu?      Excl. in GC?    No data found.  Updated Vital Signs BP 136/75 (BP Location: Right Arm)   Pulse 96   Temp 98.2 F (36.8 C) (Oral)   Resp 16   LMP 03/23/2022   SpO2 97%   Visual Acuity Right Eye Distance:   Left  Eye Distance:   Bilateral Distance:    Right Eye Near:   Left Eye Near:    Bilateral Near:     Physical Exam Constitutional:      Appearance: Normal appearance.  HENT:     Mouth/Throat:     Mouth: Mucous membranes are moist.     Tonsils: Tonsillar exudate present. 3+ on the right. 3+ on the left.  Cardiovascular:     Rate and Rhythm: Normal rate and regular rhythm.  Pulmonary:     Effort: Pulmonary effort is normal.  Musculoskeletal:     Cervical back: Normal range of motion and neck supple. Tenderness present.  Neurological:     General: No focal deficit present.     Mental Status: She is alert.  Psychiatric:        Mood and Affect: Mood normal.      UC Treatments / Results  Labs (all labs ordered are listed, but only abnormal results are displayed) Labs Reviewed  POCT URINALYSIS DIPSTICK, ED / UC - Abnormal; Notable for the following components:      Result Value   Bilirubin Urine SMALL (*)    Ketones, ur TRACE (*)    Hgb urine dipstick LARGE (*)    Protein, ur 100 (*)    All other components within normal limits  HIV ANTIBODY (ROUTINE TESTING W  REFLEX)  RPR  CERVICOVAGINAL ANCILLARY ONLY  CYTOLOGY, (ORAL, ANAL, URETHRAL) ANCILLARY ONLY   Strep postivie  EKG   Radiology No results found.  Procedures Procedures (including critical care time)  Medications Ordered in UC Medications - No data to display  Initial Impression / Assessment and Plan / UC Course  I have reviewed the triage vital signs and the nursing notes.  Pertinent labs & imaging results that were available during my care of the patient were reviewed by me and considered in my medical decision making (see chart for details).     Final Clinical Impressions(s) / UC Diagnoses   Final diagnoses:  Streptococcal sore throat  Screen for STD (sexually transmitted disease)     Discharge Instructions      You were diagnosed with strep throat today.  I have sent out amoxicillin to the pharmacy, as well as diflucan for possible yeast infection that may develop.  Please get a new toothbrush in 2 days and launder all bedding.  Your STD swabs and lab work will be resulted tomorrow.  You will be notified if anything is positive.     ED Prescriptions     Medication Sig Dispense Auth. Provider   amoxicillin (AMOXIL) 500 MG capsule Take 1 capsule (500 mg total) by mouth 3 (three) times daily. 30 capsule Derek Huneycutt, MD   fluconazole (DIFLUCAN) 150 MG tablet 1 tab po at start of symptoms, repeat 3 days later 2 tablet Jannifer Franklin, MD      PDMP not reviewed this encounter.   Jannifer Franklin, MD 03/23/22 1401

## 2022-03-23 NOTE — ED Triage Notes (Signed)
Pt is here for std testing and sore throat x 3days

## 2022-03-23 NOTE — Discharge Instructions (Signed)
You were diagnosed with strep throat today.  I have sent out amoxicillin to the pharmacy, as well as diflucan for possible yeast infection that may develop.  Please get a new toothbrush in 2 days and launder all bedding.  Your STD swabs and lab work will be resulted tomorrow.  You will be notified if anything is positive.

## 2022-03-24 LAB — RPR: RPR Ser Ql: NONREACTIVE

## 2022-03-26 LAB — CERVICOVAGINAL ANCILLARY ONLY
Bacterial Vaginitis (gardnerella): NEGATIVE
Candida Glabrata: NEGATIVE
Candida Vaginitis: POSITIVE — AB
Chlamydia: NEGATIVE
Comment: NEGATIVE
Comment: NEGATIVE
Comment: NEGATIVE
Comment: NEGATIVE
Comment: NEGATIVE
Comment: NORMAL
Neisseria Gonorrhea: NEGATIVE
Trichomonas: NEGATIVE

## 2022-03-26 LAB — CYTOLOGY, (ORAL, ANAL, URETHRAL) ANCILLARY ONLY
Chlamydia: NEGATIVE
Comment: NEGATIVE
Comment: NEGATIVE
Comment: NORMAL
Neisseria Gonorrhea: NEGATIVE
Trichomonas: NEGATIVE

## 2022-04-05 ENCOUNTER — Encounter: Payer: Medicaid Other | Admitting: Podiatry

## 2022-05-01 ENCOUNTER — Other Ambulatory Visit (HOSPITAL_COMMUNITY)
Admission: RE | Admit: 2022-05-01 | Discharge: 2022-05-01 | Disposition: A | Discharge status: Home / Self Care | Payer: Medicaid Other | Source: Ambulatory Visit | Attending: Obstetrics and Gynecology | Admitting: Obstetrics and Gynecology

## 2022-05-01 ENCOUNTER — Encounter: Payer: Self-pay | Admitting: Obstetrics and Gynecology

## 2022-05-01 ENCOUNTER — Ambulatory Visit (INDEPENDENT_AMBULATORY_CARE_PROVIDER_SITE_OTHER): Payer: Medicaid Other | Admitting: Obstetrics and Gynecology

## 2022-05-01 VITALS — BP 116/78 | HR 82 | Ht 62.0 in | Wt 178.0 lb

## 2022-05-01 DIAGNOSIS — Z113 Encounter for screening for infections with a predominantly sexual mode of transmission: Secondary | ICD-10-CM | POA: Diagnosis not present

## 2022-05-01 DIAGNOSIS — Z23 Encounter for immunization: Secondary | ICD-10-CM | POA: Diagnosis not present

## 2022-05-01 DIAGNOSIS — Z01419 Encounter for gynecological examination (general) (routine) without abnormal findings: Secondary | ICD-10-CM

## 2022-05-01 NOTE — Progress Notes (Addendum)
22 y.o New GYN presents for AEX.  Pt requested STD screening and HPV vaccine.  HPV injection given in RD, tolerated well. Next injection is due June 29, 2022 and the 3rd Injection October 29, 2022.

## 2022-05-01 NOTE — Progress Notes (Signed)
ANNUAL EXAM Patient name: Pamela Wiley MRN [id number]  Date of birth: 29-Apr-2000 Chief Complaint:   NEW GYN  History of Present Illness:   Pamela Wiley is a 22 y.o. G0P0000 with Patient's last menstrual period was 04/25/2022 (exact date). being seen today for a routine annual exam.  Current complaints: None. Desires STI testing.    Upstream - 05/01/22 1013       Pregnancy Intention Screening   Does the patient want to become pregnant in the next year? No    Does the patient's partner want to become pregnant in the next year? No    Would the patient like to discuss contraceptive options today? No      Contraception Wrap Up   Current Method Abstinence;Female Condom    End Method Female Condom    Contraception Counseling Provided No    How was the end contraceptive method provided? N/A            The pregnancy intention screening data noted above was reviewed. Potential methods of contraception were discussed. The patient elected to proceed with Female Condom.   Last pap n/a Last mammogram: n/a. Family h/o breast cancer: yes grandmother Last colonoscopy: n/a. Family h/o colorectal cancer: yes grandfather HPV vaccine: never received     05/01/2022   10:12 AM  Depression screen PHQ 2/9  Decreased Interest 0  Down, Depressed, Hopeless 0  PHQ - 2 Score 0  Altered sleeping 0  Tired, decreased energy 0  Change in appetite 0  Feeling bad or failure about yourself  0  Trouble concentrating 0  Moving slowly or fidgety/restless 0  Suicidal thoughts 0  PHQ-9 Score 0  Difficult doing work/chores Not difficult at all        No data to display         Review of Systems:   Pertinent items are noted in HPI Denies any headaches, blurred vision, fatigue, shortness of breath, chest pain, abdominal pain, abnormal vaginal discharge/itching/odor/irritation, problems with periods, bowel movements, urination, or intercourse unless otherwise stated  above. Pertinent History Reviewed:  Reviewed past medical,surgical, social and family history.  Reviewed problem list, medications and allergies. Physical Assessment:   Vitals:   05/01/22 1007  BP: 116/78  Pulse: 82  Weight: 178 lb (80.7 kg)  Height: 5\' 2"  (1.575 m)  Body mass index is 32.56 kg/m.        Physical Examination:   General appearance - well appearing, and in no distress  Mental status - alert, oriented to person, place, and time  Chest - respiratory effort normal  Heart - normal peripheral perfusion  Breasts - breasts appear normal, no suspicious masses, no skin or nipple changes or axillary nodes  Abdomen - soft, nontender, nondistended, no masses or organomegaly  Pelvic - VULVA: normal appearing vulva with no masses, tenderness or lesions  Chaperone present for exam  No results found for this or any previous visit (from the past 24 hour(s)).  Assessment & Plan:  1) Well-Woman Exam Mammogram: @ 20yo, or sooner if problems Colonoscopy: @ 20yo, or sooner if problems Pap: @ 21yo Gardasil: First dose administered today GC/CT: Ordered HIV/HCV: Ordered  2) Desire for STI testing GC,CT, trich, HIV, HCV, RPR, & Hep B ordered - pt request full testing for STI  Labs/procedures today:  Orders Placed This Encounter  Procedures   HPV 9-valent vaccine,Recombinat   RPR   HIV antibody (with reflex)   Hepatitis C Antibody  Hepatitis B Surface AntiGEN   Meds: No orders of the defined types were placed in this encounter.  Follow-up: Return in about 8 weeks (around 06/29/2022) for gardasil 2nd dose.  Inez Catalina, MD 05/01/2022 10:53 AM

## 2022-05-02 LAB — CERVICOVAGINAL ANCILLARY ONLY
Chlamydia: NEGATIVE
Comment: NEGATIVE
Comment: NEGATIVE
Comment: NORMAL
Neisseria Gonorrhea: NEGATIVE
Trichomonas: NEGATIVE

## 2022-05-02 LAB — RPR: RPR Ser Ql: NONREACTIVE

## 2022-05-02 LAB — HEPATITIS B SURFACE ANTIGEN: Hepatitis B Surface Ag: NEGATIVE

## 2022-05-02 LAB — HEPATITIS C ANTIBODY: Hep C Virus Ab: NONREACTIVE

## 2022-05-02 LAB — HIV ANTIBODY (ROUTINE TESTING W REFLEX): HIV Screen 4th Generation wRfx: NONREACTIVE

## 2022-07-02 ENCOUNTER — Ambulatory Visit: Payer: Medicaid Other

## 2022-07-02 ENCOUNTER — Encounter: Payer: Self-pay | Admitting: Advanced Practice Midwife

## 2022-07-02 ENCOUNTER — Ambulatory Visit (INDEPENDENT_AMBULATORY_CARE_PROVIDER_SITE_OTHER): Payer: Medicaid Other | Admitting: Advanced Practice Midwife

## 2022-07-02 VITALS — BP 122/80 | HR 60 | Ht 62.0 in | Wt 177.2 lb

## 2022-07-02 DIAGNOSIS — Z23 Encounter for immunization: Secondary | ICD-10-CM | POA: Diagnosis not present

## 2022-07-02 DIAGNOSIS — N946 Dysmenorrhea, unspecified: Secondary | ICD-10-CM | POA: Diagnosis not present

## 2022-07-02 NOTE — Progress Notes (Signed)
Pt reports having heavier and painful periods. Pt would like to have an ultrasound done to rule out cysts. Pt does not want birth control. Second HPV vaccine given. No other concerns at this time.

## 2022-07-02 NOTE — Progress Notes (Signed)
   GYNECOLOGY PROGRESS NOTE  History:  22 y.o. G0P0000 presents to [place] office today for problem gyn visit. She reports her periods have gradually become more painful over the last few months to a year.  The pain is well controlled with Tylenol. She is mostly worried because her sister had a cyst recently and had to have surgery to have it removed. She is worried that she could have cysts.  She denies h/a, dizziness, shortness of breath, n/v, or fever/chills.    The following portions of the patient's history were reviewed and updated as appropriate: allergies, current medications, past family history, past medical history, past social history, past surgical history and problem list.   Health Maintenance Due  Topic Date Due   COVID-19 Vaccine (1) Never done   DTaP/Tdap/Td (1 - Tdap) Never done   HPV VACCINES (3 - 3-dose series) 10/30/2022     Review of Systems:  Pertinent items are noted in HPI.   Objective:  Physical Exam Blood pressure 122/80, pulse 60, height 5\' 2"  (1.575 m), weight 177 lb 3.2 oz (80.4 kg), last menstrual period 06/24/2022. VS reviewed, nursing note reviewed,  Constitutional: well developed, well nourished, no distress HEENT: normocephalic CV: normal rate Pulm/chest wall: normal effort Breast Exam: deferred Abdomen: soft Neuro: alert and oriented x 3 Skin: warm, dry Psych: affect normal Pelvic exam: Deferred  Assessment & Plan:    1. Nonavalent human papilloma virus (HPV) vaccine for HPV types 6, 11, 16, 18, 31, 33, 45, 52, and 58 administered  - HPV 9-valent vaccine,Recombinat  2. Dysmenorrhea --Given painful periods, and family hx of cysts, will do OP pelvic US.  Discussed cysts, including functional cysts, with patient and how most cysts do not become large and painful and most do not require surgery.   --Pt with regular menstrual cycles and no other gyn concerns. - US PELVIC COMPLETE WITH TRANSVAGINAL; Future    Return in about 1 year  (around 07/02/2023) for annual exam, or sooner as needed .   Fatima Blank, CNM 2:23 PM

## 2022-07-16 ENCOUNTER — Encounter (HOSPITAL_BASED_OUTPATIENT_CLINIC_OR_DEPARTMENT_OTHER): Payer: Self-pay | Admitting: Advanced Practice Midwife

## 2022-07-16 ENCOUNTER — Ambulatory Visit (HOSPITAL_BASED_OUTPATIENT_CLINIC_OR_DEPARTMENT_OTHER)
Admission: RE | Admit: 2022-07-16 | Discharge: 2022-07-16 | Disposition: A | Discharge status: Home / Self Care | Payer: Medicaid Other | Source: Ambulatory Visit | Attending: Advanced Practice Midwife | Admitting: Advanced Practice Midwife

## 2022-07-16 DIAGNOSIS — N83209 Unspecified ovarian cyst, unspecified side: Secondary | ICD-10-CM | POA: Insufficient documentation

## 2022-07-16 DIAGNOSIS — N946 Dysmenorrhea, unspecified: Secondary | ICD-10-CM | POA: Insufficient documentation

## 2022-08-22 NOTE — Progress Notes (Unsigned)
Patient ID: Pamela Wiley, female    DOB: [birth year]  MRN: [id number]  CC: Prediabetes Follow-Up  Subjective: Shaindy Stavig is a 22 y.o. female who presents for prediabetes follow-up.  Her concerns today include:  Presents for prediabetes follow-up. No further issues/concerns for discussion today.   Patient Active Problem List   Diagnosis Date Noted   Hemorrhagic cyst of ovary 07/16/2022   Dysmenorrhea 07/02/2022   Prediabetes 02/21/2022   Tonsillitis 06/04/2018     Current Outpatient Medications on File Prior to Visit  Medication Sig Dispense Refill   acetaminophen (TYLENOL) 500 MG tablet Take 2 tablets (1,000 mg total) by mouth every 6 (six) hours as needed. 30 tablet 0   amoxicillin (AMOXIL) 500 MG capsule Take 1 capsule (500 mg total) by mouth 3 (three) times daily. (Patient not taking: Reported on 05/01/2022) 30 capsule 0   fluconazole (DIFLUCAN) 150 MG tablet 1 tab po at start of symptoms, repeat 3 days later (Patient not taking: Reported on 05/01/2022) 2 tablet 0   ibuprofen (ADVIL) 800 MG tablet Take 1 tablet (800 mg total) by mouth every 8 (eight) hours as needed. (Patient not taking: Reported on 05/01/2022) 30 tablet 0   No current facility-administered medications on file prior to visit.    No Known Allergies  Social History   Socioeconomic History   Marital status: Single    Spouse name: Not on file   Number of children: 0   Years of education: Not on file   Highest education level: GED or equivalent  Occupational History   Not on file  Tobacco Use   Smoking status: Never    Passive exposure: Never   Smokeless tobacco: Never  Vaping Use   Vaping Use: Never used  Substance and Sexual Activity   Alcohol use: Yes    Alcohol/week: 2.0 standard drinks of alcohol    Types: 2 Shots of liquor per week    Comment: sometimes   Drug use: Not Currently    Types: Marijuana   Sexual activity: Not Currently    Birth control/protection: None  Other  Topics Concern   Not on file  Social History Narrative   Not on file   Social Determinants of Health   Financial Resource Strain: Low Risk  (08/22/2022)   Overall Financial Resource Strain (CARDIA)    Difficulty of Paying Living Expenses: Not hard at all  Food Insecurity: No Food Insecurity (08/22/2022)   Hunger Vital Sign    Worried About Running Out of Food in the Last Year: Never true    Ran Out of Food in the Last Year: Never true  Transportation Needs: No Transportation Needs (08/22/2022)   PRAPARE - Administrator, Civil Service (Medical): No    Lack of Transportation (Non-Medical): No  Physical Activity: Insufficiently Active (08/22/2022)   Exercise Vital Sign    Days of Exercise per Week: 4 days    Minutes of Exercise per Session: 20 min  Stress: No Stress Concern Present (08/22/2022)   [employer] of Occupational Health - Occupational Stress Questionnaire    Feeling of Stress : Not at all  Social Connections: Socially Isolated (08/22/2022)   Social Connection and Isolation Panel [NHANES]    Frequency of Communication with Friends and Family: More than three times a week    Frequency of Social Gatherings with Friends and Family: More than three times a week    Attends Religious Services: Never    Production manager of [employer]  or Organizations: No    Attends Engineer, structural: Not on file    Marital Status: Never married  Intimate Partner Violence: Not on file    Family History  Problem Relation Age of Onset   Cancer Maternal Grandfather     Past Surgical History:  Procedure Laterality Date   FOOT SURGERY Left     ROS: Review of Systems Negative except as stated above  PHYSICAL EXAM: BP 105/73 (BP Location: Left Arm, Patient Position: Sitting, Cuff Size: Normal)   Pulse 84   Temp 98.7 F (37.1 C)   Resp 14   Ht 5\' 2"  (1.575 m)   Wt 175 lb (79.4 kg)   LMP 08/22/2022   SpO2 99%   BMI 32.01 kg/m   Physical Exam HENT:     Head:  Normocephalic and atraumatic.  Eyes:     Extraocular Movements: Extraocular movements intact.     Conjunctiva/sclera: Conjunctivae normal.     Pupils: Pupils are equal, round, and reactive to light.  Cardiovascular:     Rate and Rhythm: Normal rate and regular rhythm.     Pulses: Normal pulses.     Heart sounds: Normal heart sounds.  Pulmonary:     Effort: Pulmonary effort is normal.     Breath sounds: Normal breath sounds.  Musculoskeletal:     Cervical back: Normal range of motion and neck supple.  Neurological:     General: No focal deficit present.     Mental Status: She is alert and oriented to person, place, and time.  Psychiatric:        Mood and Affect: Mood normal.        Behavior: Behavior normal.   Results for orders placed or performed in visit on 08/23/22  POCT glycosylated hemoglobin (Hb A1C)  Result Value Ref Range   Hemoglobin A1C     HbA1c POC (<> result, manual entry)     HbA1c, POC (prediabetic range) 5.7 5.7 - 6.4 %   HbA1c, POC (controlled diabetic range)      ASSESSMENT AND PLAN: 1. Prediabetes - Hemoglobin A1c remaining at prediabetes at 5.7%. This is improved compared to previous 5.9%. - Discussed the importance of healthy eating habits, low-carbohydrate diet, low-sugar diet, and regular aerobic exercise (at least 150 minutes a week as tolerated). - Follow-up with primary provider in 6 months or sooner if needed.  - POCT glycosylated hemoglobin (Hb A1C)   Patient was given the opportunity to ask questions.  Patient verbalized understanding of the plan and was able to repeat key elements of the plan. Patient was given clear instructions to go to Emergency Department or return to medical center if symptoms don't improve, worsen, or new problems develop.The patient verbalized understanding.   Orders Placed This Encounter  Procedures   POCT glycosylated hemoglobin (Hb A1C)   Return in about 6 months (around 02/23/2023) for Follow-Up or next available  prediabetes.  Rema Fendt, NP

## 2022-08-23 ENCOUNTER — Encounter: Payer: Self-pay | Admitting: Family

## 2022-08-23 ENCOUNTER — Ambulatory Visit (INDEPENDENT_AMBULATORY_CARE_PROVIDER_SITE_OTHER): Payer: Medicaid Other | Admitting: Family

## 2022-08-23 VITALS — BP 105/73 | HR 84 | Temp 98.7°F | Resp 14 | Ht 62.0 in | Wt 175.0 lb

## 2022-08-23 DIAGNOSIS — R7303 Prediabetes: Secondary | ICD-10-CM

## 2022-08-23 LAB — POCT GLYCOSYLATED HEMOGLOBIN (HGB A1C): HbA1c, POC (prediabetic range): 5.7 % (ref 5.7–6.4)

## 2022-08-23 NOTE — Progress Notes (Signed)
Pt is here for follow up.

## 2022-11-26 ENCOUNTER — Ambulatory Visit
Admission: EM | Admit: 2022-11-26 | Discharge: 2022-11-26 | Disposition: A | Discharge status: Home / Self Care | Payer: Medicaid Other | Attending: Physician Assistant | Admitting: Physician Assistant

## 2022-11-26 DIAGNOSIS — Z113 Encounter for screening for infections with a predominantly sexual mode of transmission: Secondary | ICD-10-CM | POA: Insufficient documentation

## 2022-11-26 NOTE — ED Triage Notes (Signed)
"  I want STI testing". I currently have "no symptoms". I am also "pre diabetic and want my A1C Hgb checked".  Note: I discussed with her the need for the Hgb A1C to be done by PCP and followed there.  No current symptoms of concern.

## 2022-11-26 NOTE — ED Provider Notes (Signed)
[facility]    CSN: [id number] Arrival date & time: 11/26/22  1347      History   Chief Complaint Chief Complaint  Patient presents with   SEXUALLY TRANSMITTED DISEASE    Testing    HPI Pamela Wiley is a 22 y.o. female.   Patient here today requesting STD screening.  She does not have any symptoms at this time.  No known exposures.  Also makes comment about having hemoglobin A1c checked as she was considered prediabetic in the past.  The history is provided by the patient.    Past Medical History:  Diagnosis Date   Medical history non-contributory     Patient Active Problem List   Diagnosis Date Noted   Hemorrhagic cyst of ovary 07/16/2022   Dysmenorrhea 07/02/2022   Prediabetes 02/21/2022   Tonsillitis 06/04/2018    Past Surgical History:  Procedure Laterality Date   FOOT SURGERY Left     OB History     Gravida  0   Para  0   Term  0   Preterm  0   AB  0   Living  0      SAB  0   IAB  0   Ectopic  0   Multiple  0   Live Births  0            Home Medications    Prior to Admission medications   Medication Sig Start Date End Date Taking? Authorizing Provider  acetaminophen (TYLENOL) 500 MG tablet Take 2 tablets (1,000 mg total) by mouth every 6 (six) hours as needed. 11/23/21   Carlisle Beers, FNP  amoxicillin (AMOXIL) 500 MG capsule Take 1 capsule (500 mg total) by mouth 3 (three) times daily. Patient not taking: Reported on 05/01/2022 03/23/22   Jannifer Franklin, MD  fluconazole (DIFLUCAN) 150 MG tablet 1 tab po at start of symptoms, repeat 3 days later Patient not taking: Reported on 05/01/2022 03/23/22   Jannifer Franklin, MD  ibuprofen (ADVIL) 800 MG tablet Take 1 tablet (800 mg total) by mouth every 8 (eight) hours as needed. Patient not taking: Reported on 05/01/2022 02/23/22   Edwin Cap, DPM    Family History Family History  Problem Relation Age of Onset   Cancer Maternal Grandfather      Social History Social History   Tobacco Use   Smoking status: Never    Passive exposure: Never   Smokeless tobacco: Never  Vaping Use   Vaping status: Never Used  Substance Use Topics   Alcohol use: Yes    Alcohol/week: 2.0 standard drinks of alcohol    Types: 2 Shots of liquor per week    Comment: sometimes   Drug use: Yes    Types: Marijuana     Allergies   Patient has no known allergies.   Review of Systems Review of Systems  Constitutional:  Negative for chills and fever.  Eyes:  Negative for discharge and redness.  Gastrointestinal:  Negative for abdominal pain, nausea and vomiting.  Genitourinary:  Negative for dysuria, genital sores and vaginal discharge.     Physical Exam Triage Vital Signs ED Triage Vitals  Encounter Vitals Group     BP 11/26/22 1359 110/76     Systolic BP Percentile --      Diastolic BP Percentile --      Pulse Rate 11/26/22 1359 73     Resp 11/26/22 1359 16     Temp 11/26/22 1359  98 F (36.7 C)     Temp Source 11/26/22 1359 Oral     SpO2 11/26/22 1359 99 %     Weight 11/26/22 1358 177 lb (80.3 kg)     Height 11/26/22 1358 5\' 2"  (1.575 m)     Head Circumference --      Peak Flow --      Pain Score 11/26/22 1358 0     Pain Loc --      Pain Education --      Exclude from Growth Chart --    No data found.  Updated Vital Signs BP 110/76 (BP Location: Left Arm)   Pulse 73   Temp 98 F (36.7 C) (Oral)   Resp 16   Ht 5\' 2"  (1.575 m)   Wt 177 lb (80.3 kg)   LMP 11/17/2022 (Exact Date)   SpO2 99%   BMI 32.37 kg/m      Physical Exam Vitals and nursing note reviewed.  Constitutional:      General: She is not in acute distress.    Appearance: Normal appearance. She is not ill-appearing.  HENT:     Head: Normocephalic and atraumatic.  Eyes:     Conjunctiva/sclera: Conjunctivae normal.  Cardiovascular:     Rate and Rhythm: Normal rate.  Pulmonary:     Effort: Pulmonary effort is normal. No respiratory distress.   Neurological:     Mental Status: She is alert.  Psychiatric:        Mood and Affect: Mood normal.        Behavior: Behavior normal.        Thought Content: Thought content normal.      UC Treatments / Results  Labs (all labs ordered are listed, but only abnormal results are displayed) Labs Reviewed  RPR  HIV ANTIBODY (ROUTINE TESTING W REFLEX)  CYTOLOGY, (ORAL, ANAL, URETHRAL) ANCILLARY ONLY    EKG   Radiology No results found.  Procedures Procedures (including critical care time)  Medications Ordered in UC Medications - No data to display  Initial Impression / Assessment and Plan / UC Course  I have reviewed the triage vital signs and the nursing notes.  Pertinent labs & imaging results that were available during my care of the patient were reviewed by me and considered in my medical decision making (see chart for details).    STD screening ordered as requested.  Patient request blood work as well.  Advise she would need to see PCP for hemoglobin A1c screening.  Patient expressed understanding.  Final Clinical Impressions(s) / UC Diagnoses   Final diagnoses:  Screening examination for STD (sexually transmitted disease)   Discharge Instructions   None    ED Prescriptions   None    PDMP not reviewed this encounter.   Tomi Bamberger, PA-C 11/26/22 2011

## 2022-12-12 ENCOUNTER — Encounter: Payer: Medicaid Other | Admitting: Family

## 2022-12-12 NOTE — Progress Notes (Signed)
Erroneous encounter-disregard

## 2023-02-25 ENCOUNTER — Ambulatory Visit: Payer: Medicaid Other | Admitting: Family

## 2023-02-25 ENCOUNTER — Other Ambulatory Visit (HOSPITAL_COMMUNITY)
Admission: RE | Admit: 2023-02-25 | Discharge: 2023-02-25 | Disposition: A | Discharge status: Home / Self Care | Payer: Medicaid Other | Source: Ambulatory Visit | Attending: Family | Admitting: Family

## 2023-02-25 ENCOUNTER — Encounter: Payer: Self-pay | Admitting: Family

## 2023-02-25 VITALS — BP 118/84 | HR 69 | Temp 98.1°F | Ht 62.0 in | Wt 177.6 lb

## 2023-02-25 DIAGNOSIS — R7303 Prediabetes: Secondary | ICD-10-CM | POA: Diagnosis not present

## 2023-02-25 DIAGNOSIS — Z113 Encounter for screening for infections with a predominantly sexual mode of transmission: Secondary | ICD-10-CM | POA: Insufficient documentation

## 2023-02-25 DIAGNOSIS — R490 Dysphonia: Secondary | ICD-10-CM

## 2023-02-25 LAB — POCT GLYCOSYLATED HEMOGLOBIN (HGB A1C): HbA1c, POC (controlled diabetic range): 5.7 % (ref 0.0–7.0)

## 2023-02-25 NOTE — Progress Notes (Signed)
Patient ID: Pamela Wiley, female    DOB: [birth year]  MRN: [id number]  CC: Follow-Up  Subjective: Pamela Wiley is a 22 y.o. female who presents for follow-up.   Her concerns today include:  - Reports hoarseness of voice and increased saliva production  for several months. States she does "yell and scream a lot" when doing "rap battles". She denies red flag symptoms. States she had "chlamydia of the throat" 2 years ago and received treatment. Reports she was retested and results were normal. States she thinks this may be contributing to hoarseness. She requests testing of chlamydia oral swab. Also, she would like referral to a specialist for further evaluation.  - Cavities. States she does eat sweet foods. Reports she brushes her teeth and flosses several times daily. Reports she is established with Dentistry. - States "Can you get HIV from anal sex?" Reports she is not sexually active and declines HIV testing on today. I answered patient's question in detail and she verbalized understanding.   Patient Active Problem List   Diagnosis Date Noted   Hemorrhagic cyst of ovary 07/16/2022   Dysmenorrhea 07/02/2022   Prediabetes 02/21/2022   Tonsillitis 06/04/2018     Current Outpatient Medications on File Prior to Visit  Medication Sig Dispense Refill   acetaminophen (TYLENOL) 500 MG tablet Take 2 tablets (1,000 mg total) by mouth every 6 (six) hours as needed. 30 tablet 0   No current facility-administered medications on file prior to visit.    No Known Allergies  Social History   Socioeconomic History   Marital status: Single    Spouse name: Not on file   Number of children: 0   Years of education: Not on file   Highest education level: GED or equivalent  Occupational History   Not on file  Tobacco Use   Smoking status: Never    Passive exposure: Never   Smokeless tobacco: Never  Vaping Use   Vaping status: Never Used  Substance and Sexual Activity    Alcohol use: Yes    Alcohol/week: 2.0 standard drinks of alcohol    Types: 2 Shots of liquor per week    Comment: sometimes   Drug use: Yes    Types: Marijuana   Sexual activity: Not Currently    Birth control/protection: None  Other Topics Concern   Not on file  Social History Narrative   Not on file   Social Determinants of Health   Financial Resource Strain: Patient Declined (02/24/2023)   Overall Financial Resource Strain (CARDIA)    Difficulty of Paying Living Expenses: Patient declined  Food Insecurity: No Food Insecurity (02/24/2023)   Hunger Vital Sign    Worried About Running Out of Food in the Last Year: Never true    Ran Out of Food in the Last Year: Never true  Transportation Needs: No Transportation Needs (02/24/2023)   PRAPARE - Administrator, Civil Service (Medical): No    Lack of Transportation (Non-Medical): No  Physical Activity: Inactive (02/24/2023)   Exercise Vital Sign    Days of Exercise per Week: 0 days    Minutes of Exercise per Session: 20 min  Stress: No Stress Concern Present (02/24/2023)   [employer] of Occupational Health - Occupational Stress Questionnaire    Feeling of Stress : Only a little  Social Connections: Unknown (02/24/2023)   Social Connection and Isolation Panel [NHANES]    Frequency of Communication with Friends and Family: More than three times a  week    Frequency of Social Gatherings with Friends and Family: Three times a week    Attends Religious Services: Never    Active Member of Clubs or Organizations: Yes    Attends Banker Meetings: 1 to 4 times per year    Marital Status: Patient declined  Catering manager Violence: Not on file    Family History  Problem Relation Age of Onset   Cancer Maternal Grandfather     Past Surgical History:  Procedure Laterality Date   FOOT SURGERY Left     ROS: Review of Systems Negative except as stated above  PHYSICAL EXAM: BP 118/84   Pulse 69    Temp 98.1 F (36.7 C) (Oral)   Ht 5\' 2"  (1.575 m)   Wt 177 lb 9.6 oz (80.6 kg)   LMP 02/12/2023 (Exact Date)   SpO2 97%   BMI 32.48 kg/m   Physical Exam HENT:     Head: Normocephalic and atraumatic.     Nose: Nose normal.     Mouth/Throat:     Mouth: Mucous membranes are moist.     Pharynx: Oropharynx is clear.  Eyes:     Extraocular Movements: Extraocular movements intact.     Conjunctiva/sclera: Conjunctivae normal.     Pupils: Pupils are equal, round, and reactive to light.  Cardiovascular:     Rate and Rhythm: Normal rate and regular rhythm.     Pulses: Normal pulses.     Heart sounds: Normal heart sounds.  Pulmonary:     Effort: Pulmonary effort is normal.     Breath sounds: Normal breath sounds.  Musculoskeletal:        General: Normal range of motion.     Cervical back: Normal range of motion and neck supple.  Neurological:     General: No focal deficit present.     Mental Status: She is alert and oriented to person, place, and time.  Psychiatric:        Mood and Affect: Mood normal.        Behavior: Behavior normal.     Results for orders placed or performed in visit on 02/25/23  POCT glycosylated hemoglobin (Hb A1C)  Result Value Ref Range   Hemoglobin A1C     HbA1c POC (<> result, manual entry)     HbA1c, POC (prediabetic range)     HbA1c, POC (controlled diabetic range) 5.7 0.0 - 7.0 %    ASSESSMENT AND PLAN: 1. Hoarseness of voice - Referral to ENT for further evaluation/management.  - Ambulatory referral to ENT  2. Prediabetes - Hemoglobin A1c 5.7% and remaining consistent with prediabetes.  - Follow-up with primary provider in 6 months or sooner if needed.  - POCT glycosylated hemoglobin (Hb A1C)  3. Routine screening for STI (sexually transmitted infection) - Routine screening.  - Cervicovaginal ancillary only   Patient was given the opportunity to ask questions.  Patient verbalized understanding of the plan and was able to repeat key  elements of the plan. Patient was given clear instructions to go to Emergency Department or return to medical center if symptoms don't improve, worsen, or new problems develop.The patient verbalized understanding.   Orders Placed This Encounter  Procedures   Ambulatory referral to ENT   POCT glycosylated hemoglobin (Hb A1C)     Return in about 6 months (around 08/25/2023) for Follow-Up or next available prediabetes .  Rema Fendt, NP

## 2023-02-25 NOTE — Progress Notes (Signed)
Patient states she has a lot of things to discuss with you.  Declined Flu vaccine.

## 2023-02-26 LAB — CERVICOVAGINAL ANCILLARY ONLY
Chlamydia: NEGATIVE
Comment: NEGATIVE
Comment: NEGATIVE
Comment: NORMAL
Neisseria Gonorrhea: NEGATIVE
Trichomonas: NEGATIVE

## 2023-03-06 ENCOUNTER — Encounter (INDEPENDENT_AMBULATORY_CARE_PROVIDER_SITE_OTHER): Payer: Self-pay | Admitting: Otolaryngology

## 2023-04-23 ENCOUNTER — Telehealth: Payer: Self-pay | Admitting: Podiatry

## 2023-04-23 NOTE — Telephone Encounter (Signed)
 Pt left message stating she was needing a replacement pair of orthotics.  I returned call and told pt she would need an appt to get another pair of orthotics and she states she was told she could call and they could order her another pair. I explained she has not been seen since 02/2022 and she said she was just in the office in October. I do not see an appt for October. She req to speak with orthotic dept.

## 2023-06-12 ENCOUNTER — Ambulatory Visit
Admission: EM | Admit: 2023-06-12 | Discharge: 2023-06-12 | Disposition: A | Discharge status: Home / Self Care | Payer: Medicaid Other | Attending: Family Medicine | Admitting: Family Medicine

## 2023-06-12 DIAGNOSIS — Z113 Encounter for screening for infections with a predominantly sexual mode of transmission: Secondary | ICD-10-CM | POA: Insufficient documentation

## 2023-06-12 NOTE — ED Triage Notes (Signed)
 Pt presents for Std testing. Pt states she wants blood work.  Pt states she also wants blood work. Pt wants her throat swabbed.  Denies sxs.

## 2023-06-12 NOTE — ED Provider Notes (Signed)
 [facility]    CSN: [id number] Arrival date & time: 06/12/23  1529      History   Chief Complaint Chief Complaint  Patient presents with   SEXUALLY TRANSMITTED DISEASE    HPI Pamela Wiley is a 23 y.o. female presents for STD screening.  She currently denies any symptoms including vaginal discharge, dysuria, fevers, nausea/vomiting, flank pain.  No known STD exposure.  She is also requesting throat swab for gonorrhea.  No other concerns at this time.  HPI  Past Medical History:  Diagnosis Date   Medical history non-contributory     Patient Active Problem List   Diagnosis Date Noted   Hemorrhagic cyst of ovary 07/16/2022   Dysmenorrhea 07/02/2022   Prediabetes 02/21/2022   Tonsillitis 06/04/2018    Past Surgical History:  Procedure Laterality Date   FOOT SURGERY Left     OB History     Gravida  0   Para  0   Term  0   Preterm  0   AB  0   Living  0      SAB  0   IAB  0   Ectopic  0   Multiple  0   Live Births  0            Home Medications    Prior to Admission medications   Medication Sig Start Date End Date Taking? Authorizing Provider  acetaminophen (TYLENOL) 500 MG tablet Take 2 tablets (1,000 mg total) by mouth every 6 (six) hours as needed. 11/23/21   Carlisle Beers, FNP    Family History Family History  Problem Relation Age of Onset   Cancer Maternal Grandfather     Social History Social History   Tobacco Use   Smoking status: Never    Passive exposure: Never   Smokeless tobacco: Never  Vaping Use   Vaping status: Never Used  Substance Use Topics   Alcohol use: Yes    Alcohol/week: 2.0 standard drinks of alcohol    Types: 2 Shots of liquor per week    Comment: sometimes   Drug use: Yes    Types: Marijuana     Allergies   Patient has no known allergies.   Review of Systems Review of Systems  Genitourinary:        STD screening     Physical Exam Triage Vital Signs ED  Triage Vitals [06/12/23 1539]  Encounter Vitals Group     BP 106/73     Systolic BP Percentile      Diastolic BP Percentile      Pulse Rate 89     Resp 17     Temp 98.2 F (36.8 C)     Temp Source Oral     SpO2 97 %     Weight      Height      Head Circumference      Peak Flow      Pain Score 0     Pain Loc      Pain Education      Exclude from Growth Chart    No data found.  Updated Vital Signs BP 106/73 (BP Location: Right Arm)   Pulse 89   Temp 98.2 F (36.8 C) (Oral)   Resp 17   LMP 06/10/2023 (Exact Date)   SpO2 97%   Visual Acuity Right Eye Distance:   Left Eye Distance:   Bilateral Distance:    Right Eye Near:  Left Eye Near:    Bilateral Near:     Physical Exam Vitals and nursing note reviewed.  Constitutional:      Appearance: Normal appearance.  HENT:     Head: Normocephalic and atraumatic.  Eyes:     Pupils: Pupils are equal, round, and reactive to light.  Cardiovascular:     Rate and Rhythm: Normal rate.  Pulmonary:     Effort: Pulmonary effort is normal.  Abdominal:     Tenderness: There is no right CVA tenderness or left CVA tenderness.  Skin:    General: Skin is warm and dry.  Neurological:     General: No focal deficit present.     Mental Status: She is alert and oriented to person, place, and time.  Psychiatric:        Mood and Affect: Mood normal.        Behavior: Behavior normal.      UC Treatments / Results  Labs (all labs ordered are listed, but only abnormal results are displayed) Labs Reviewed  RPR  HIV ANTIBODY (ROUTINE TESTING W REFLEX)  CERVICOVAGINAL ANCILLARY ONLY  CYTOLOGY, (ORAL, ANAL, URETHRAL) ANCILLARY ONLY    EKG   Radiology No results found.  Procedures Procedures (including critical care time)  Medications Ordered in UC Medications - No data to display  Initial Impression / Assessment and Plan / UC Course  I have reviewed the triage vital signs and the nursing notes.  Pertinent labs &  imaging results that were available during my care of the patient were reviewed by me and considered in my medical decision making (see chart for details).     STD testing as ordered and will contact for any positive results. Follow up as needed Final Clinical Impressions(s) / UC Diagnoses   Final diagnoses:  Screening examination for STD (sexually transmitted disease)   Discharge Instructions   None    ED Prescriptions   None    PDMP not reviewed this encounter.   Radford Pax, NP 06/12/23 [phone]

## 2023-06-12 NOTE — Discharge Instructions (Addendum)
 The clinic will contact you for any positive results of the testing done today.  Follow-up with your PCP as needed.

## 2023-06-13 LAB — HIV ANTIBODY (ROUTINE TESTING W REFLEX): HIV Screen 4th Generation wRfx: NONREACTIVE

## 2023-06-13 LAB — CERVICOVAGINAL ANCILLARY ONLY
Chlamydia: NEGATIVE
Comment: NEGATIVE
Comment: NEGATIVE
Comment: NORMAL
Neisseria Gonorrhea: NEGATIVE
Trichomonas: NEGATIVE

## 2023-06-13 LAB — CYTOLOGY, (ORAL, ANAL, URETHRAL) ANCILLARY ONLY
Chlamydia: NEGATIVE
Comment: NEGATIVE
Comment: NORMAL
Neisseria Gonorrhea: NEGATIVE

## 2023-06-13 LAB — RPR: RPR Ser Ql: NONREACTIVE

## 2023-06-20 ENCOUNTER — Encounter: Payer: Self-pay | Admitting: Podiatry

## 2023-06-20 ENCOUNTER — Ambulatory Visit (INDEPENDENT_AMBULATORY_CARE_PROVIDER_SITE_OTHER): Payer: Medicaid Other | Admitting: Podiatry

## 2023-06-20 DIAGNOSIS — Q6689 Other  specified congenital deformities of feet: Secondary | ICD-10-CM | POA: Diagnosis not present

## 2023-06-20 DIAGNOSIS — M2142 Flat foot [pes planus] (acquired), left foot: Secondary | ICD-10-CM

## 2023-06-20 DIAGNOSIS — M2141 Flat foot [pes planus] (acquired), right foot: Secondary | ICD-10-CM

## 2023-06-20 NOTE — Progress Notes (Signed)
       Chief Complaint  Patient presents with   Follow-up    Patient would like to get new orthotics    HPI: 23 y.o. female presents today requesting movement of orthotics.  She has been seen in the past by Dr. Lilian Kapur.  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  Patient states orthotics have been really helpful for her, she states that she recently lost her last pair.  Past Medical History:  Diagnosis Date   Medical history non-contributory     Past Surgical History:  Procedure Laterality Date   FOOT SURGERY Left     No Known Allergies  ROS    Physical Exam: There were no vitals filed for this visit.  General: The patient is alert and oriented x3 in no acute distress.  Dermatology: Skin is warm, dry and supple bilateral lower extremities. Interspaces are clear of maceration and debris.    Vascular: Palpable pedal pulses bilaterally. Capillary refill within normal limits.  No appreciable edema.  No erythema or calor.  Neurological: Light touch sensation grossly intact bilateral feet.   Musculoskeletal Exam: Pes planus foot type appreciated bilaterally.  Left foot there is some pain on palpation of sinus tarsi region with limited range of motion to the subtalar joint compared to the right side.   Prior radiographs reviewed left foot: Demonstrating near complete ossification of left foot calcaneonavicular bar.  There is some subtalar joint abnormality appreciated as well.  Assessment/Plan of Care: 1. Pes planus of both feet   2. Calcaneonavicular bar      No orders of the defined types were placed in this encounter.  None  Discussed clinical findings with patient today.  Reports that she has done very well with orthotic devices in the past and that she is requesting new pair as she recently lost her prior pair.  Will place an order for semirigid orthosis to reduce hindfoot motion.   Tauriel Scronce L. Marchia Bond, AACFAS [facility]     [address] [place], [place] [address]                Office [phone]  Fax [phone]

## 2023-06-23 ENCOUNTER — Encounter: Payer: Self-pay | Admitting: Podiatry

## 2023-07-16 ENCOUNTER — Ambulatory Visit: Payer: Medicaid Other | Admitting: Advanced Practice Midwife

## 2023-07-16 ENCOUNTER — Encounter: Payer: Self-pay | Admitting: Advanced Practice Midwife

## 2023-07-16 ENCOUNTER — Other Ambulatory Visit (HOSPITAL_COMMUNITY)
Admission: RE | Admit: 2023-07-16 | Discharge: 2023-07-16 | Disposition: A | Discharge status: Home / Self Care | Source: Ambulatory Visit | Attending: Advanced Practice Midwife | Admitting: Advanced Practice Midwife

## 2023-07-16 VITALS — BP 117/75 | HR 89 | Ht 62.0 in | Wt 165.0 lb

## 2023-07-16 DIAGNOSIS — Z124 Encounter for screening for malignant neoplasm of cervix: Secondary | ICD-10-CM | POA: Insufficient documentation

## 2023-07-16 DIAGNOSIS — Z01419 Encounter for gynecological examination (general) (routine) without abnormal findings: Secondary | ICD-10-CM

## 2023-07-16 NOTE — Progress Notes (Signed)
 No c/o today. Declines STD today.

## 2023-07-16 NOTE — Progress Notes (Signed)
   Subjective:     Pamela Wiley is a 23 y.o. female here at [facility] for a routine exam.  Current complaints: none.  Personal and family health history reviewed: yes.  Do you have a primary care provider? yes Do you feel safe at home? yes  Flowsheet Row Office Visit from 02/25/2023 in [facility] Total Score 0       Health Maintenance Due  Topic Date Due   DTaP/Tdap/Td (1 - Tdap) Never done   HPV VACCINES (3 - 3-dose series) 10/30/2022   COVID-19 Vaccine (1 - 2024-25 season) Never done   Cervical Cancer Screening (Pap smear)  Never done     Risk factors for chronic health problems: Smoking: Alchohol/how much: Pt BMI: Body mass index is 30.18 kg/m.   Gynecologic History Patient's last menstrual period was 07/11/2023. Contraception: abstinence Last Pap: n/a.  Last mammogram: n/a.   Obstetric History OB History  Gravida Para Term Preterm AB Living  0 0 0 0 0 0  SAB IAB Ectopic Multiple Live Births  0 0 0 0 0     The following portions of the patient's history were reviewed and updated as appropriate: allergies, current medications, past family history, past medical history, past social history, past surgical history, and problem list.  Review of Systems Pertinent items noted in HPI and remainder of comprehensive ROS otherwise negative.    Objective:  BP 117/75   Pulse 89   Ht 5\' 2"  (1.575 m)   Wt 165 lb (74.8 kg)   LMP 07/11/2023   BMI 30.18 kg/m   VS reviewed, nursing note reviewed,  Constitutional: well developed, well nourished, no distress HEENT: normocephalic, thyroid without enlargement or mass HEART: RRR, no murmurs rubs/gallops RESP: clear and equal to auscultation bilaterally in all lobes  Breast Exam:  exam performed: right breast normal without mass, skin or nipple changes or axillary nodes, left breast normal without mass, skin or nipple changes or axillary nodes Abdomen: soft Neuro: alert  and oriented x 3 Skin: warm, dry Psych: affect normal Pelvic exam: Performed: Cervix pink, visually closed, without lesion, scant white creamy discharge, vaginal walls and external genitalia normal Bimanual exam: Cervix 0/long/high, firm, anterior, neg CMT, uterus nontender, nonenlarged, adnexa without tenderness, enlargement, or mass        Assessment/Plan:   1. Encounter for annual routine gynecological examination --No gyn concerns or problems, not currently sexually active  2. Screening for cervical cancer (Primary)  - Cytology - PAP( [place])      Return in about 1 year (around 07/15/2024) for annual exam.   Sharen Counter, CNM 1:48 PM

## 2023-07-18 LAB — CYTOLOGY - PAP
Comment: NEGATIVE
Diagnosis: NEGATIVE
High risk HPV: NEGATIVE

## 2023-07-20 ENCOUNTER — Encounter: Payer: Self-pay | Admitting: Advanced Practice Midwife

## 2023-07-20 MED ORDER — METRONIDAZOLE 500 MG PO TABS: 500.0000 mg | ORAL_TABLET | Freq: Two times a day (BID) | ORAL | 0 refills | Status: AC

## 2023-07-20 NOTE — Addendum Note (Signed)
 Addended by: Sharen Counter A on: 07/20/2023 11:17 PM   Modules accepted: Orders

## 2023-07-26 ENCOUNTER — Ambulatory Visit

## 2023-08-26 ENCOUNTER — Encounter: Payer: Self-pay | Admitting: Family

## 2023-08-26 ENCOUNTER — Other Ambulatory Visit (HOSPITAL_COMMUNITY)
Admission: RE | Admit: 2023-08-26 | Discharge: 2023-08-26 | Disposition: A | Discharge status: Home / Self Care | Source: Ambulatory Visit | Attending: Family | Admitting: Family

## 2023-08-26 ENCOUNTER — Ambulatory Visit: Payer: Medicaid Other | Admitting: Family

## 2023-08-26 VITALS — BP 111/75 | HR 80 | Temp 98.2°F | Resp 16 | Ht 62.0 in | Wt 178.0 lb

## 2023-08-26 DIAGNOSIS — Z114 Encounter for screening for human immunodeficiency virus [HIV]: Secondary | ICD-10-CM | POA: Diagnosis not present

## 2023-08-26 DIAGNOSIS — R7303 Prediabetes: Secondary | ICD-10-CM | POA: Diagnosis not present

## 2023-08-26 DIAGNOSIS — Z1321 Encounter for screening for nutritional disorder: Secondary | ICD-10-CM | POA: Diagnosis not present

## 2023-08-26 DIAGNOSIS — Z113 Encounter for screening for infections with a predominantly sexual mode of transmission: Secondary | ICD-10-CM | POA: Diagnosis not present

## 2023-08-26 NOTE — Progress Notes (Signed)
 6 month follow up STD Check oral swab and vaginal and blood work.  A1C check. Patient is concerned about 1 gray hair

## 2023-08-26 NOTE — Progress Notes (Signed)
 Patient ID: Pamela Wiley, female    DOB: Mar 01, 2001  MRN: [id number]  CC: Prediabetes Follow-Up  Subjective: Pamela Wiley is a 23 y.o. female who presents for prediabetes follow-up.   Her concerns today include:  - Prediabetes follow-up.  - STD testing. She denies recent exposure/symptoms. States she would like oral and vaginal STD testing.  - States she has 1 gray hair that she plucked out and wants to get tested to make sure she doesn't have a vitamin deficiency.  - She has questions about Allergy testing through states "skin pricks" but denies referral to Allergy.  Patient Active Problem List   Diagnosis Date Noted   Hemorrhagic cyst of ovary 07/16/2022   Dysmenorrhea 07/02/2022   Prediabetes 02/21/2022   Tonsillitis 06/04/2018     No current outpatient medications on file prior to visit.   No current facility-administered medications on file prior to visit.    No Known Allergies  Social History   Socioeconomic History   Marital status: Single    Spouse name: Not on file   Number of children: 0   Years of education: Not on file   Highest education level: GED or equivalent  Occupational History   Not on file  Tobacco Use   Smoking status: Never    Passive exposure: Never   Smokeless tobacco: Never  Vaping Use   Vaping status: Never Used  Substance and Sexual Activity   Alcohol use: Yes    Alcohol/week: 2.0 standard drinks of alcohol    Types: 2 Shots of liquor per week    Comment: sometimes   Drug use: Yes    Types: Marijuana   Sexual activity: Not Currently    Birth control/protection: None  Other Topics Concern   Not on file  Social History Narrative   Not on file   Social Drivers of Health   Financial Resource Strain: Patient Declined (02/24/2023)   Overall Financial Resource Strain (CARDIA)    Difficulty of Paying Living Expenses: Patient declined  Food Insecurity: No Food Insecurity (02/24/2023)   Hunger Vital Sign     Worried About Running Out of Food in the Last Year: Never true    Ran Out of Food in the Last Year: Never true  Transportation Needs: No Transportation Needs (02/24/2023)   PRAPARE - Administrator, Civil Service (Medical): No    Lack of Transportation (Non-Medical): No  Physical Activity: Unknown (02/24/2023)   Exercise Vital Sign    Days of Exercise per Week: 0 days    Minutes of Exercise per Session: Not on file  Recent Concern: Physical Activity - Inactive (02/24/2023)   Exercise Vital Sign    Days of Exercise per Week: 0 days    Minutes of Exercise per Session: 20 min  Stress: No Stress Concern Present (02/24/2023)   [employer] of Occupational Health - Occupational Stress Questionnaire    Feeling of Stress : Only a little  Social Connections: Unknown (02/24/2023)   Social Connection and Isolation Panel [NHANES]    Frequency of Communication with Friends and Family: More than three times a week    Frequency of Social Gatherings with Friends and Family: Three times a week    Attends Religious Services: Never    Active Member of Clubs or Organizations: Yes    Attends Banker Meetings: 1 to 4 times per year    Marital Status: Patient declined  Intimate Partner Violence: Not At Risk (08/26/2023)  Humiliation, Afraid, Rape, and Kick questionnaire    Fear of Current or Ex-Partner: No    Emotionally Abused: No    Physically Abused: No    Sexually Abused: No    Family History  Problem Relation Age of Onset   Cancer Maternal Grandfather     Past Surgical History:  Procedure Laterality Date   FOOT SURGERY Left     ROS: Review of Systems Negative except as stated above  PHYSICAL EXAM: BP 111/75   Pulse 80   Temp 98.2 F (36.8 C) (Oral)   Resp 16   Ht 5\' 2"  (1.575 m)   Wt 178 lb (80.7 kg)   LMP 08/13/2023 (Exact Date)   SpO2 99%   BMI 32.56 kg/m   Physical Exam HENT:     Head: Normocephalic and atraumatic.     Nose: Nose normal.      Mouth/Throat:     Mouth: Mucous membranes are moist.     Pharynx: Oropharynx is clear.  Eyes:     Extraocular Movements: Extraocular movements intact.     Conjunctiva/sclera: Conjunctivae normal.     Pupils: Pupils are equal, round, and reactive to light.  Cardiovascular:     Rate and Rhythm: Normal rate and regular rhythm.     Pulses: Normal pulses.     Heart sounds: Normal heart sounds.  Pulmonary:     Effort: Pulmonary effort is normal.     Breath sounds: Normal breath sounds.  Musculoskeletal:        General: Normal range of motion.     Cervical back: Normal range of motion and neck supple.  Neurological:     General: No focal deficit present.     Mental Status: She is alert and oriented to person, place, and time.  Psychiatric:        Mood and Affect: Mood normal.        Behavior: Behavior normal.     ASSESSMENT AND PLAN: 1. Prediabetes (Primary) - Routine screening.  - Hemoglobin A1c  2. Routine screening for STI (sexually transmitted infection) - Routine screening.  - Cervicovaginal ancillary only - Cervicovaginal ancillary only  3. Encounter for screening for HIV - Routine screening.  - HIV antibody (with reflex)  4. Encounter for vitamin deficiency screening - Routine screening.  - Vitamin D, 25-hydroxy   Patient was given the opportunity to ask questions.  Patient verbalized understanding of the plan and was able to repeat key elements of the plan. Patient was given clear instructions to go to Emergency Department or return to medical center if symptoms don't improve, worsen, or new problems develop.The patient verbalized understanding.   Orders Placed This Encounter  Procedures   Hemoglobin A1c   Vitamin D, 25-hydroxy   HIV antibody (with reflex)    Follow-up with primary provider as scheduled.   Senaida Dama, NP

## 2023-08-27 ENCOUNTER — Ambulatory Visit: Payer: Self-pay | Admitting: Family

## 2023-08-27 LAB — CERVICOVAGINAL ANCILLARY ONLY
Chlamydia: NEGATIVE
Comment: NEGATIVE
Comment: NORMAL
Neisseria Gonorrhea: NEGATIVE

## 2023-08-27 LAB — HEMOGLOBIN A1C
Est. average glucose Bld gHb Est-mCnc: 111 mg/dL
Hgb A1c MFr Bld: 5.5 % (ref 4.8–5.6)

## 2023-08-27 LAB — VITAMIN D 25 HYDROXY (VIT D DEFICIENCY, FRACTURES): Vit D, 25-Hydroxy: 39 ng/mL (ref 30.0–100.0)

## 2023-08-27 LAB — HIV ANTIBODY (ROUTINE TESTING W REFLEX): HIV Screen 4th Generation wRfx: NONREACTIVE

## 2023-08-28 LAB — CERVICOVAGINAL ANCILLARY ONLY
Bacterial Vaginitis (gardnerella): NEGATIVE
Candida Glabrata: NEGATIVE
Candida Vaginitis: NEGATIVE
Chlamydia: NEGATIVE
Comment: NEGATIVE
Comment: NEGATIVE
Comment: NEGATIVE
Comment: NEGATIVE
Comment: NEGATIVE
Comment: NORMAL
Neisseria Gonorrhea: NEGATIVE
Trichomonas: NEGATIVE

## 2023-12-03 NOTE — Progress Notes (Signed)
 [place] / not covered

## 2024-01-11 DIAGNOSIS — L600 Ingrowing nail: Secondary | ICD-10-CM | POA: Diagnosis not present

## 2024-01-11 DIAGNOSIS — M79674 Pain in right toe(s): Secondary | ICD-10-CM | POA: Diagnosis present

## 2024-01-12 ENCOUNTER — Encounter (HOSPITAL_BASED_OUTPATIENT_CLINIC_OR_DEPARTMENT_OTHER): Payer: Self-pay

## 2024-01-12 ENCOUNTER — Other Ambulatory Visit: Payer: Self-pay

## 2024-01-12 ENCOUNTER — Emergency Department (HOSPITAL_BASED_OUTPATIENT_CLINIC_OR_DEPARTMENT_OTHER)
Admission: EM | Admit: 2024-01-12 | Discharge: 2024-01-12 | Disposition: A | Attending: Emergency Medicine | Admitting: Emergency Medicine

## 2024-01-12 DIAGNOSIS — L600 Ingrowing nail: Secondary | ICD-10-CM

## 2024-01-12 MED ORDER — CEPHALEXIN 250 MG PO CAPS
500.0000 mg | ORAL_CAPSULE | Freq: Once | ORAL | Status: AC
  Administered 2024-01-12: 500 mg via ORAL
  Filled 2024-01-12: qty 2

## 2024-01-12 MED ORDER — CEPHALEXIN 500 MG PO CAPS: 500.0000 mg | ORAL_CAPSULE | Freq: Four times a day (QID) | ORAL | 0 refills | Status: AC

## 2024-01-12 MED ORDER — CEPHALEXIN 500 MG PO CAPS: 500.0000 mg | ORAL_CAPSULE | Freq: Four times a day (QID) | ORAL | 0 refills | Status: DC

## 2024-01-12 NOTE — Discharge Instructions (Addendum)
 Begin taking Keflex as prescribed.  Apply warm compresses or perform warm soaks as frequently as possible for the next several days.  Apply bacitracin in the corner of the nail when not soaking.  Return to the ER if symptoms significantly worsen or change.

## 2024-01-12 NOTE — ED Triage Notes (Signed)
 Patient here POV from Home.  Endorses Right lateral large toe swelling and pain from ingrown nail.   Tearful during triage. A&Ox4. Gcs 15. Ambulatory.

## 2024-01-12 NOTE — ED Provider Notes (Signed)
  [facility] [facility] AT [facility] [place] Provider Note   CSN: [id number] Arrival date & time: 01/11/24  2349     Patient presents with: Toe Pain   Pamela Wiley is a 23 y.o. female.   Patient is a 23 year old female presenting with complaints of right great toe pain.  She has some swelling and irritation to the medial aspect of the nail.  Pain is worse with ambulation and palpation.  No alleviating factors.       Prior to Admission medications   Not on File    Allergies: Patient has no known allergies.    Review of Systems  All other systems reviewed and are negative.   Updated Vital Signs BP 130/78   Pulse 81   Temp 98.9 F (37.2 C) (Oral)   Resp 14   Ht 5' 2 (1.575 m)   Wt 79.4 kg   SpO2 99%   BMI 32.01 kg/m   Physical Exam Vitals and nursing note reviewed.  Constitutional:      Appearance: Normal appearance.  Pulmonary:     Effort: Pulmonary effort is normal.  Skin:    General: Skin is warm and dry.     Comments: The right great toenail has a area of mild erythema adjacent to the medial aspect of the nail.  There is no purulent drainage.  Neurological:     Mental Status: She is alert and oriented to person, place, and time.     (all labs ordered are listed, but only abnormal results are displayed) Labs Reviewed - No data to display  EKG: None  Radiology: No results found.   Procedures   Medications Ordered in the ED  cephALEXin (KEFLEX) capsule 500 mg (has no administration in time range)                                    Medical Decision Making Risk Prescription drug management.   Patient presenting with a mildly ingrown toenail.  At this point, I do not feel as though the nail needs to be trimmed or excised.  I think this will respond well with warm soaks and antibiotics.  Patient to follow-up as needed.  Final diagnoses:  None    ED Discharge Orders     None          Geroldine Berg,  MD 01/12/24 [id number]

## 2024-01-30 ENCOUNTER — Ambulatory Visit
Admission: EM | Admit: 2024-01-30 | Discharge: 2024-01-30 | Disposition: A | Discharge status: Home / Self Care | Attending: Family Medicine | Admitting: Family Medicine

## 2024-01-30 DIAGNOSIS — R102 Pelvic and perineal pain unspecified side: Secondary | ICD-10-CM | POA: Diagnosis not present

## 2024-01-30 MED ORDER — ACETAMINOPHEN 325 MG PO TABS
975.0000 mg | ORAL_TABLET | Freq: Once | ORAL | Status: AC
  Administered 2024-01-30: 975 mg via ORAL

## 2024-01-30 MED ORDER — CELECOXIB 200 MG PO CAPS: 200.0000 mg | ORAL_CAPSULE | Freq: Two times a day (BID) | ORAL | 0 refills | Status: AC

## 2024-01-30 NOTE — ED Provider Notes (Signed)
 [facility]  Note:  This document was prepared using Conservation officer, historic buildings and may include unintentional dictation errors.  MRN: [id number] DOB: 09-Apr-2001  Subjective:   Pamela Wiley is a 23 y.o. female presenting for acute onset of severe pelvic cramps and pains.  Started her cycle today.  Has a history of diarrhea, hemorrhagic cysts.  Last ultrasound completed was April 2024 confirming the hemorrhagic cyst.  She does have a gynecologist.  Declines urine testing, UPT, vaginal cytology as she is not sexually active.  Has no urinary symptoms.  While in the clinic today, felt lightheaded from her pain.  Also felt nauseous.  No ongoing headache, confusion, weakness, numbness or tingling.  No history of neurologic disorders.  No current facility-administered medications for this encounter.  Current Outpatient Medications:    ibuprofen  (ADVIL ) 200 MG tablet, Take 200 mg by mouth every 6 (six) hours as needed., Disp: , Rfl:    cephALEXin (KEFLEX) 500 MG capsule, Take 1 capsule (500 mg total) by mouth 4 (four) times daily., Disp: 28 capsule, Rfl: 0   No Known Allergies  Past Medical History:  Diagnosis Date   Medical history non-contributory      Past Surgical History:  Procedure Laterality Date   FOOT SURGERY Left     Family History  Problem Relation Age of Onset   Cancer Maternal Grandfather     Social History   Tobacco Use   Smoking status: Never    Passive exposure: Never   Smokeless tobacco: Never  Vaping Use   Vaping status: Never Used  Substance Use Topics   Alcohol use: Not Currently    Alcohol/week: 2.0 standard drinks of alcohol    Types: 2 Shots of liquor per week    Comment: sometimes   Drug use: Not Currently    Types: Marijuana    ROS   Objective:   Vitals: BP 124/85 (BP Location: Left Arm)   Pulse 85   Temp 98.5 F (36.9 C) (Oral)   Resp 18   LMP 01/30/2024 (Exact Date)   SpO2 98%   Physical  Exam Constitutional:      General: She is not in acute distress.    Appearance: Normal appearance. She is well-developed. She is not ill-appearing, toxic-appearing or diaphoretic.  HENT:     Head: Normocephalic and atraumatic.     Nose: Nose normal.     Mouth/Throat:     Mouth: Mucous membranes are moist.  Eyes:     General: No scleral icterus.       Right eye: No discharge.        Left eye: No discharge.     Extraocular Movements: Extraocular movements intact.     Conjunctiva/sclera: Conjunctivae normal.  Cardiovascular:     Rate and Rhythm: Normal rate.  Pulmonary:     Effort: Pulmonary effort is normal.  Abdominal:     General: Bowel sounds are normal. There is no distension.     Palpations: Abdomen is soft. There is no mass.     Tenderness: There is abdominal tenderness in the periumbilical area and suprapubic area. There is no right CVA tenderness, left CVA tenderness, guarding or rebound.  Skin:    General: Skin is warm and dry.  Neurological:     General: No focal deficit present.     Mental Status: She is alert and oriented to person, place, and time.  Psychiatric:        Mood and  Affect: Mood normal.        Behavior: Behavior normal.        Thought Content: Thought content normal.        Judgment: Judgment normal.    Patient given 975 mg Tylenol  p.o. in clinic. Assessment and Plan :   I have reviewed the PDMP during this encounter.  1. Acute pelvic pain, female    No signs of an acute gynecologic emergency.  Patient is hemodynamically stable and appropriate for outpatient management at this stage.  Maintain strict ER precautions.  Recommended celecoxib for her dysmenorrhea.  Emphasized need for close follow-up with her gynecologist and PCP to pursue outpatient imaging.  Counseled patient on potential for adverse effects with medications prescribed today, patient verbalized understanding.    Christopher Savannah, PA-C 01/30/24 [id number]

## 2024-01-30 NOTE — Discharge Instructions (Addendum)
 You have received Tylenol  in our clinic for your pelvic pain and cramps.  Because you just took ibuprofen , I recommend waiting until 6 PM today to take celecoxib for ongoing pelvic cramps and pains, dysmenorrhea.  Follow-up urgently with your PCP or gynecologist for recheck and consideration for imaging like an ultrasound or CT scan of the abdominal/pelvic area.  If your symptoms persist or worsen please go to straight to the emergency room.

## 2024-01-30 NOTE — ED Triage Notes (Signed)
 Pt reports she started her menstrual period today and she is in pain, she feels like she will pass out. States this is the first time this happened. States she took 4 tab of Advil  200 mg,1 hr ago.

## 2024-02-26 ENCOUNTER — Encounter: Payer: Self-pay | Admitting: Family

## 2024-02-26 ENCOUNTER — Other Ambulatory Visit (HOSPITAL_COMMUNITY)
Admission: RE | Admit: 2024-02-26 | Discharge: 2024-02-26 | Disposition: A | Discharge status: Home / Self Care | Source: Ambulatory Visit | Attending: Family | Admitting: Family

## 2024-02-26 ENCOUNTER — Ambulatory Visit (INDEPENDENT_AMBULATORY_CARE_PROVIDER_SITE_OTHER): Admitting: Family

## 2024-02-26 VITALS — BP 116/75 | HR 84 | Temp 98.2°F | Resp 16 | Ht 62.0 in | Wt 183.8 lb

## 2024-02-26 DIAGNOSIS — Z13228 Encounter for screening for other metabolic disorders: Secondary | ICD-10-CM

## 2024-02-26 DIAGNOSIS — Z0000 Encounter for general adult medical examination without abnormal findings: Secondary | ICD-10-CM | POA: Diagnosis not present

## 2024-02-26 DIAGNOSIS — Z130 Encounter for screening for diseases of the blood and blood-forming organs and certain disorders involving the immune mechanism: Secondary | ICD-10-CM

## 2024-02-26 DIAGNOSIS — Z113 Encounter for screening for infections with a predominantly sexual mode of transmission: Secondary | ICD-10-CM

## 2024-02-26 DIAGNOSIS — Z1321 Encounter for screening for nutritional disorder: Secondary | ICD-10-CM

## 2024-02-26 DIAGNOSIS — Z13220 Encounter for screening for lipoid disorders: Secondary | ICD-10-CM

## 2024-02-26 DIAGNOSIS — Z131 Encounter for screening for diabetes mellitus: Secondary | ICD-10-CM | POA: Diagnosis not present

## 2024-02-26 DIAGNOSIS — Z114 Encounter for screening for human immunodeficiency virus [HIV]: Secondary | ICD-10-CM

## 2024-02-26 DIAGNOSIS — Z1329 Encounter for screening for other suspected endocrine disorder: Secondary | ICD-10-CM

## 2024-02-26 NOTE — Progress Notes (Signed)
 Patient ID: Pamela Wiley, female    DOB: January 18, 2001  MRN: [id number]  CC: Annual Exam  Subjective: Pamela Wiley is a 23 y.o. female who presents for annual exam.   Her concerns today include:  - Up to date on cervical cancer screening per [facility]. - Routine STD screening. Denies symptoms/recent exposure. - Requests Vitamin D  lab.  Patient Active Problem List   Diagnosis Date Noted   Hemorrhagic cyst of ovary 07/16/2022   Dysmenorrhea 07/02/2022   Prediabetes 02/21/2022   Tonsillitis 06/04/2018     Current Outpatient Medications on File Prior to Visit  Medication Sig Dispense Refill   celecoxib (CELEBREX) 200 MG capsule Take 1 capsule (200 mg total) by mouth 2 (two) times daily. (Patient not taking: Reported on 02/26/2024) 20 capsule 0   cephALEXin (KEFLEX) 500 MG capsule Take 1 capsule (500 mg total) by mouth 4 (four) times daily. (Patient not taking: Reported on 02/26/2024) 28 capsule 0   ibuprofen  (ADVIL ) 200 MG tablet Take 200 mg by mouth every 6 (six) hours as needed. (Patient not taking: Reported on 02/26/2024)     No current facility-administered medications on file prior to visit.    No Known Allergies  Social History   Socioeconomic History   Marital status: Single    Spouse name: Not on file   Number of children: 0   Years of education: Not on file   Highest education level: GED or equivalent  Occupational History   Not on file  Tobacco Use   Smoking status: Never    Passive exposure: Never   Smokeless tobacco: Never  Vaping Use   Vaping status: Never Used  Substance and Sexual Activity   Alcohol use: Not Currently    Alcohol/week: 2.0 standard drinks of alcohol    Types: 2 Shots of liquor per week    Comment: sometimes   Drug use: Yes    Types: Marijuana   Sexual activity: Yes    Birth control/protection: None  Other Topics Concern   Not on file  Social History Narrative   Not on file   Social Drivers of Health    Financial Resource Strain: Patient Declined (02/24/2023)   Overall Financial Resource Strain (CARDIA)    Difficulty of Paying Living Expenses: Patient declined  Food Insecurity: No Food Insecurity (02/24/2023)   Hunger Vital Sign    Worried About Running Out of Food in the Last Year: Never true    Ran Out of Food in the Last Year: Never true  Transportation Needs: No Transportation Needs (02/24/2023)   PRAPARE - Administrator, Civil Service (Medical): No    Lack of Transportation (Non-Medical): No  Physical Activity: Unknown (02/24/2023)   Exercise Vital Sign    Days of Exercise per Week: 0 days    Minutes of Exercise per Session: Not on file  Recent Concern: Physical Activity - Inactive (02/24/2023)   Exercise Vital Sign    Days of Exercise per Week: 0 days    Minutes of Exercise per Session: 20 min  Stress: No Stress Concern Present (02/24/2023)   [employer] of Occupational Health - Occupational Stress Questionnaire    Feeling of Stress : Only a little  Social Connections: Unknown (02/24/2023)   Social Connection and Isolation Panel    Frequency of Communication with Friends and Family: More than three times a week    Frequency of Social Gatherings with Friends and Family: Three times a week  Attends Religious Services: Never    Active Member of Clubs or Organizations: Yes    Attends Banker Meetings: 1 to 4 times per year    Marital Status: Patient declined  Intimate Partner Violence: Not At Risk (08/26/2023)   Humiliation, Afraid, Rape, and Kick questionnaire    Fear of Current or Ex-Partner: No    Emotionally Abused: No    Physically Abused: No    Sexually Abused: No    Family History  Problem Relation Age of Onset   Cancer Maternal Grandfather     Past Surgical History:  Procedure Laterality Date   FOOT SURGERY Left     ROS: Review of Systems Negative except as stated above  PHYSICAL EXAM: BP 116/75   Pulse 84   Temp  98.2 F (36.8 C) (Oral)   Resp 16   Ht 5' 2 (1.575 m)   Wt 183 lb 12.8 oz (83.4 kg)   LMP 01/23/2024   SpO2 97%   BMI 33.62 kg/m   Physical Exam HENT:     Head: Normocephalic and atraumatic.     Right Ear: Tympanic membrane, ear canal and external ear normal.     Left Ear: Tympanic membrane, ear canal and external ear normal.     Nose: Nose normal.     Mouth/Throat:     Mouth: Mucous membranes are moist.     Pharynx: Oropharynx is clear.  Eyes:     Extraocular Movements: Extraocular movements intact.     Conjunctiva/sclera: Conjunctivae normal.     Pupils: Pupils are equal, round, and reactive to light.  Neck:     Thyroid: No thyroid mass, thyromegaly or thyroid tenderness.  Cardiovascular:     Rate and Rhythm: Normal rate and regular rhythm.     Pulses: Normal pulses.     Heart sounds: Normal heart sounds.  Pulmonary:     Effort: Pulmonary effort is normal.     Breath sounds: Normal breath sounds.  Chest:     Comments: Patient declined. Abdominal:     General: Bowel sounds are normal.     Palpations: Abdomen is soft.  Genitourinary:    Comments: Patient declined. Musculoskeletal:        General: Normal range of motion.     Right shoulder: Normal.     Left shoulder: Normal.     Right upper arm: Normal.     Left upper arm: Normal.     Right elbow: Normal.     Left elbow: Normal.     Right forearm: Normal.     Left forearm: Normal.     Right wrist: Normal.     Left wrist: Normal.     Right hand: Normal.     Left hand: Normal.     Cervical back: Normal, normal range of motion and neck supple.     Thoracic back: Normal.     Lumbar back: Normal.     Right hip: Normal.     Left hip: Normal.     Right upper leg: Normal.     Left upper leg: Normal.     Right knee: Normal.     Left knee: Normal.     Right lower leg: Normal.     Left lower leg: Normal.     Right ankle: Normal.     Left ankle: Normal.     Right foot: Normal.     Left foot: Normal.  Skin:     General: Skin is warm and  dry.     Capillary Refill: Capillary refill takes less than 2 seconds.  Neurological:     General: No focal deficit present.     Mental Status: She is alert and oriented to person, place, and time.  Psychiatric:        Mood and Affect: Mood normal.        Behavior: Behavior normal.     ASSESSMENT AND PLAN: 1. Annual physical exam (Primary) - Counseled on 150 minutes of exercise per week as tolerated, healthy eating (including decreased daily intake of saturated fats, cholesterol, added sugars, sodium), STI prevention, and routine healthcare maintenance.  2. Screening for metabolic disorder - Routine screening.  - CMP14+EGFR  3. Screening for deficiency anemia - Routine screening.  - CBC  4. Diabetes mellitus screening - Routine screening.  - Hemoglobin A1c  5. Screening cholesterol level - Routine screening.  - Lipid panel  6. Thyroid disorder screen - Routine screening.  - TSH  7. Routine screening for STI (sexually transmitted infection) - Routine screening.  - RPR - Cervicovaginal ancillary only  8. Encounter for screening for HIV - Routine screening.  - HIV antibody (with reflex)  9. Encounter for vitamin deficiency screening - Routine screening.  - Vitamin D , 25-hydroxy   Patient was given the opportunity to ask questions.  Patient verbalized understanding of the plan and was able to repeat key elements of the plan. Patient was given clear instructions to go to Emergency Department or return to medical center if symptoms don't improve, worsen, or new problems develop.The patient verbalized understanding.   Orders Placed This Encounter  Procedures   CBC   Lipid panel   CMP14+EGFR   Hemoglobin A1c   TSH   HIV antibody (with reflex)   RPR   Vitamin D , 25-hydroxy    Return in about 1 year (around 02/25/2025) for Physical per patient preference.  Greig JINNY Chute, NP

## 2024-02-26 NOTE — Progress Notes (Signed)
 Full blood panel for STD and a swab and she wants to be check to see if she have low vitamin counts

## 2024-02-27 ENCOUNTER — Ambulatory Visit: Payer: Self-pay | Admitting: Family

## 2024-02-27 DIAGNOSIS — B9689 Other specified bacterial agents as the cause of diseases classified elsewhere: Secondary | ICD-10-CM

## 2024-02-27 LAB — CMP14+EGFR
ALT: 11 IU/L (ref 0–32)
AST: 15 IU/L (ref 0–40)
Albumin: 4.4 g/dL (ref 4.0–5.0)
Alkaline Phosphatase: 60 IU/L (ref 41–116)
BUN/Creatinine Ratio: 17 (ref 9–23)
BUN: 11 mg/dL (ref 6–20)
Bilirubin Total: 0.3 mg/dL (ref 0.0–1.2)
CO2: 19 mmol/L — ABNORMAL LOW (ref 20–29)
Calcium: 9.2 mg/dL (ref 8.7–10.2)
Chloride: 102 mmol/L (ref 96–106)
Creatinine, Ser: 0.66 mg/dL (ref 0.57–1.00)
Globulin, Total: 2.7 g/dL (ref 1.5–4.5)
Glucose: 93 mg/dL (ref 70–99)
Potassium: 4 mmol/L (ref 3.5–5.2)
Sodium: 138 mmol/L (ref 134–144)
Total Protein: 7.1 g/dL (ref 6.0–8.5)
eGFR: 128 mL/min/1.73 (ref >59)

## 2024-02-27 LAB — LIPID PANEL
Chol/HDL Ratio: 2.2 ratio (ref 0.0–4.4)
Cholesterol, Total: 84 mg/dL — ABNORMAL LOW (ref 100–199)
HDL: 38 mg/dL — ABNORMAL LOW (ref >39)
LDL Chol Calc (NIH): 37 mg/dL (ref 0–99)
Triglycerides: 21 mg/dL (ref 0–149)
VLDL Cholesterol Cal: 9 mg/dL (ref 5–40)

## 2024-02-27 LAB — VITAMIN D 25 HYDROXY (VIT D DEFICIENCY, FRACTURES): Vit D, 25-Hydroxy: 31.1 ng/mL (ref 30.0–100.0)

## 2024-02-27 LAB — CERVICOVAGINAL ANCILLARY ONLY
Bacterial Vaginitis (gardnerella): POSITIVE — AB
Candida Glabrata: NEGATIVE
Candida Vaginitis: NEGATIVE
Chlamydia: NEGATIVE
Comment: NEGATIVE
Comment: NEGATIVE
Comment: NEGATIVE
Comment: NEGATIVE
Comment: NEGATIVE
Comment: NORMAL
Neisseria Gonorrhea: NEGATIVE
Trichomonas: NEGATIVE

## 2024-02-27 LAB — CBC
Hematocrit: 40.6 % (ref 34.0–46.6)
Hemoglobin: 12.5 g/dL (ref 11.1–15.9)
MCH: 25.9 pg — ABNORMAL LOW (ref 26.6–33.0)
MCHC: 30.8 g/dL — ABNORMAL LOW (ref 31.5–35.7)
MCV: 84 fL (ref 79–97)
Platelets: 337 x10E3/uL (ref 150–450)
RBC: 4.83 x10E6/uL (ref 3.77–5.28)
RDW: 12.8 % (ref 11.7–15.4)
WBC: 8.2 x10E3/uL (ref 3.4–10.8)

## 2024-02-27 LAB — TSH: TSH: 1.97 u[IU]/mL (ref 0.450–4.500)

## 2024-02-27 LAB — HEMOGLOBIN A1C
Est. average glucose Bld gHb Est-mCnc: 111 mg/dL
Hgb A1c MFr Bld: 5.5 % (ref 4.8–5.6)

## 2024-02-27 LAB — RPR: RPR Ser Ql: NONREACTIVE

## 2024-02-27 LAB — HIV ANTIBODY (ROUTINE TESTING W REFLEX): HIV Screen 4th Generation wRfx: NONREACTIVE

## 2024-02-28 MED ORDER — METRONIDAZOLE 500 MG PO TABS
500.0000 mg | ORAL_TABLET | Freq: Two times a day (BID) | ORAL | 0 refills | Status: AC
Start: 2024-02-28 — End: 2024-03-06

## 2025-02-25 ENCOUNTER — Encounter: Admitting: Family
